# Patient Record
Sex: Female | Born: 1995 | Race: Black or African American | Hispanic: No | Marital: Single | Smoking: Current some day smoker
Health system: Southern US, Community
[De-identification: ages and names within clinical notes are randomized; demographics above are authoritative.]

## PROBLEM LIST (undated history)

## (undated) DIAGNOSIS — F319 Bipolar disorder, unspecified: Secondary | ICD-10-CM

## (undated) HISTORY — PX: NO PAST SURGERIES: SHX2092

---
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
## 2016-12-08 ENCOUNTER — Encounter (HOSPITAL_COMMUNITY): Payer: Self-pay | Admitting: *Deleted

## 2016-12-08 ENCOUNTER — Emergency Department (HOSPITAL_COMMUNITY)
Admission: EM | Admit: 2016-12-08 | Discharge: 2016-12-09 | Disposition: A | Discharge status: Other Acute Inpatient Hospital | Payer: No Typology Code available for payment source | Attending: Emergency Medicine | Admitting: Emergency Medicine

## 2016-12-08 DIAGNOSIS — R45851 Suicidal ideations: Secondary | ICD-10-CM | POA: Diagnosis present

## 2016-12-08 DIAGNOSIS — F1290 Cannabis use, unspecified, uncomplicated: Secondary | ICD-10-CM | POA: Diagnosis not present

## 2016-12-08 DIAGNOSIS — F314 Bipolar disorder, current episode depressed, severe, without psychotic features: Secondary | ICD-10-CM | POA: Diagnosis not present

## 2016-12-08 DIAGNOSIS — Z79899 Other long term (current) drug therapy: Secondary | ICD-10-CM | POA: Diagnosis not present

## 2016-12-08 HISTORY — DX: Bipolar disorder, unspecified: F31.9

## 2016-12-08 LAB — CBC
HCT: 34.3 % — ABNORMAL LOW (ref 36.0–46.0)
Hemoglobin: 11.5 g/dL — ABNORMAL LOW (ref 12.0–15.0)
MCH: 23.4 pg — AB (ref 26.0–34.0)
MCHC: 33.5 g/dL (ref 30.0–36.0)
MCV: 69.9 fL — AB (ref 78.0–100.0)
PLATELETS: 261 10*3/uL (ref 150–400)
RBC: 4.91 MIL/uL (ref 3.87–5.11)
RDW: 14.7 % (ref 11.5–15.5)
WBC: 6.3 10*3/uL (ref 4.0–10.5)

## 2016-12-08 NOTE — ED Notes (Signed)
Bed: WLPT3 Expected date:  Expected time:  Means of arrival:  Comments: 

## 2016-12-08 NOTE — ED Triage Notes (Signed)
"  I have gotten to the point I just really want to take my life".  States SI "on and off for the past 3 weeks" Tonight she reports she was thinking of jumping in front of a car. Hx of Bipolar, not taking medications as prescribed.

## 2016-12-09 ENCOUNTER — Encounter (HOSPITAL_COMMUNITY): Payer: Self-pay

## 2016-12-09 ENCOUNTER — Inpatient Hospital Stay (HOSPITAL_COMMUNITY)
Admission: AD | Admit: 2016-12-09 | Discharge: 2016-12-14 | DRG: 885 | Disposition: A | Discharge status: Home / Self Care | Payer: No Typology Code available for payment source | Source: Intra-hospital | Attending: Psychiatry | Admitting: Psychiatry

## 2016-12-09 DIAGNOSIS — R45851 Suicidal ideations: Secondary | ICD-10-CM | POA: Diagnosis present

## 2016-12-09 DIAGNOSIS — F4322 Adjustment disorder with anxiety: Secondary | ICD-10-CM | POA: Diagnosis present

## 2016-12-09 DIAGNOSIS — Z79899 Other long term (current) drug therapy: Secondary | ICD-10-CM

## 2016-12-09 DIAGNOSIS — F1220 Cannabis dependence, uncomplicated: Secondary | ICD-10-CM | POA: Diagnosis present

## 2016-12-09 DIAGNOSIS — F339 Major depressive disorder, recurrent, unspecified: Secondary | ICD-10-CM | POA: Diagnosis present

## 2016-12-09 DIAGNOSIS — Z915 Personal history of self-harm: Secondary | ICD-10-CM

## 2016-12-09 DIAGNOSIS — F1290 Cannabis use, unspecified, uncomplicated: Secondary | ICD-10-CM

## 2016-12-09 DIAGNOSIS — F314 Bipolar disorder, current episode depressed, severe, without psychotic features: Secondary | ICD-10-CM | POA: Diagnosis present

## 2016-12-09 DIAGNOSIS — Z813 Family history of other psychoactive substance abuse and dependence: Secondary | ICD-10-CM | POA: Diagnosis not present

## 2016-12-09 LAB — COMPREHENSIVE METABOLIC PANEL
ALBUMIN: 4.6 g/dL (ref 3.5–5.0)
ALK PHOS: 64 U/L (ref 38–126)
ALT: 12 U/L — AB (ref 14–54)
AST: 16 U/L (ref 15–41)
Anion gap: 7 (ref 5–15)
BILIRUBIN TOTAL: 0.6 mg/dL (ref 0.3–1.2)
BUN: 13 mg/dL (ref 6–20)
CALCIUM: 9.1 mg/dL (ref 8.9–10.3)
CO2: 24 mmol/L (ref 22–32)
Chloride: 106 mmol/L (ref 101–111)
Creatinine, Ser: 0.65 mg/dL (ref 0.44–1.00)
GFR calc Af Amer: >60 mL/min (ref >60)
GFR calc non Af Amer: >60 mL/min (ref >60)
GLUCOSE: 98 mg/dL (ref 65–99)
Potassium: 3.4 mmol/L — ABNORMAL LOW (ref 3.5–5.1)
Sodium: 137 mmol/L (ref 135–145)
TOTAL PROTEIN: 7.6 g/dL (ref 6.5–8.1)

## 2016-12-09 LAB — RAPID URINE DRUG SCREEN, HOSP PERFORMED
AMPHETAMINES: NOT DETECTED
Barbiturates: NOT DETECTED
Benzodiazepines: NOT DETECTED
Cocaine: NOT DETECTED
OPIATES: NOT DETECTED
TETRAHYDROCANNABINOL: POSITIVE — AB

## 2016-12-09 LAB — ETHANOL: Alcohol, Ethyl (B): <5 mg/dL (ref <5)

## 2016-12-09 LAB — SALICYLATE LEVEL: Salicylate Lvl: <7 mg/dL (ref 2.8–30.0)

## 2016-12-09 LAB — ACETAMINOPHEN LEVEL: Acetaminophen (Tylenol), Serum: <10 ug/mL — ABNORMAL LOW (ref 10–30)

## 2016-12-09 LAB — PREGNANCY, URINE: Preg Test, Ur: NEGATIVE

## 2016-12-09 MED ORDER — ARIPIPRAZOLE 5 MG PO TABS
5.0000 mg | ORAL_TABLET | Freq: Every day | ORAL | Status: DC
  Administered 2016-12-09: 5 mg via ORAL
  Filled 2016-12-09: qty 1

## 2016-12-09 MED ORDER — ENSURE ENLIVE PO LIQD
237.0000 mL | Freq: Two times a day (BID) | ORAL | Status: DC
  Administered 2016-12-10 – 2016-12-14 (×5): 237 mL via ORAL

## 2016-12-09 MED ORDER — ALUM & MAG HYDROXIDE-SIMETH 200-200-20 MG/5ML PO SUSP: 30.0000 mL | ORAL | Status: DC | PRN

## 2016-12-09 MED ORDER — ARIPIPRAZOLE 5 MG PO TABS
5.0000 mg | ORAL_TABLET | Freq: Every day | ORAL | Status: DC
  Administered 2016-12-10: 5 mg via ORAL
  Filled 2016-12-09 (×3): qty 1

## 2016-12-09 MED ORDER — ACETAMINOPHEN 325 MG PO TABS: 650.0000 mg | ORAL_TABLET | ORAL | Status: DC | PRN

## 2016-12-09 MED ORDER — TRAZODONE HCL 50 MG PO TABS
50.0000 mg | ORAL_TABLET | Freq: Every evening | ORAL | Status: DC | PRN
  Administered 2016-12-10 – 2016-12-13 (×4): 50 mg via ORAL
  Filled 2016-12-09 (×3): qty 1

## 2016-12-09 MED ORDER — ACETAMINOPHEN 325 MG PO TABS
650.0000 mg | ORAL_TABLET | ORAL | Status: DC | PRN
Start: 2016-12-09 — End: 2016-12-14
  Administered 2016-12-13: 650 mg via ORAL
  Filled 2016-12-09: qty 2

## 2016-12-09 MED ORDER — IBUPROFEN 200 MG PO TABS: 600.0000 mg | ORAL_TABLET | Freq: Three times a day (TID) | ORAL | Status: DC | PRN

## 2016-12-09 MED ORDER — TRAZODONE HCL 50 MG PO TABS: 50.0000 mg | ORAL_TABLET | Freq: Every evening | ORAL | Status: DC | PRN

## 2016-12-09 MED ORDER — ONDANSETRON HCL 4 MG PO TABS: 4.0000 mg | ORAL_TABLET | Freq: Three times a day (TID) | ORAL | Status: DC | PRN

## 2016-12-09 NOTE — Consult Note (Signed)
[facility] Face-to-Face Psychiatry Consult   Reason for Consult:  Psychiatric evaluation  Referring Physician:  EDP Patient Identification: [name] MRN:  [id number] Principal Diagnosis: Bipolar 1 disorder, depressed, severe ([place]) Diagnosis:   Patient Active Problem List   Diagnosis Date Noted  . Bipolar 1 disorder, depressed, severe ([place]) [F31.4] 12/09/2016    Total Time spent with patient: 30 minutes  Subjective:   [name] is a 21 y.o. female patient who states "I slept outside yesterday."  HPI:  Per behavioral health therapeutic triage assessment, Amanda Gambrellis a 21 y.o.femalebrought in by police with PMHx of bipolar 1 disorder, who presents to the Emergency Department presenting with gradually worsening, constant, suicidal ideations that occurred PTA. Per pt she notes having a job at [employer], which she recently loss. She reports feeling stressed and "being heavy on the weed" for the past three weeks. She states two days ago, pt was asked for rent money by her mother and was kicked out of her home due to non-payment. Per nurse note, pt thought about jumping in front of a car but denies following through with the plan. Pt reports having a h/o of suicidal ideations but denies having a specific plan in place. Per pt, she reports being prescribed medication for her bipolar disorder but states "I don't think my medications really do it so I don't take it". No alleviating factors noted. Pt denies any pain and HI. She also denies using any other illicit drug use. Pt is sleepy during assessment.  She said that Thursday night her mother put her out of the house because she could not pay the rent.  She recently became unemployed.  Patient says that she has had increasing depression.  Patient made statement to a nurse earlier about stepping in front of a car to kill herself.  She now denies wanting to do that.  Patient denies any HI or A/V hallucinations. Patient smokes about a joint  a day of marijuana.  Last night was the last use.  Denies use of ETOH or other drugs. Patient has no outpatient provider.  She has been to [facility] when she was 21 years old.  [facility] Evaluation: Chart and nursing notes reviewed. Face-to-face evaluation completed with Dr [name]. Pt states she has a past psychiatric history of bipolar disorder. She states she moved from [place], [place] 5 months ago. Currently lives with her mother and cousin. States her mother told her "no need to come back home if she couldn't pay a bill." Pt states she recently quit her job. States she slept outside yesterday. States she has been paranoid and "itching and tripping and hearing voices." She reports a past suicide attempt when she was in high school via hanging. Smoke marijuana, 2 blunts/day. Denies ETOH use.   Past Psychiatric History: Bipolar disorder  Risk to Self: Suicidal Ideation: Yes-Currently Present Suicidal Intent: No (When asked about SI now, she says "no.") Is patient at risk for suicide?: Yes Suicidal Plan?: Yes-Currently Present Specify Current Suicidal Plan: Had told a nurse about walking into traffic Access to Means: Yes Specify Access to Suicidal Means: Stepping into traffic What has been your use of drugs/alcohol within the last 12 months?: THC use How many times?: 0 Other Self Harm Risks: None Triggers for Past Attempts: None known Intentional Self Injurious Behavior: None Risk to Others: Homicidal Ideation: No Thoughts of Harm to Others: No Current Homicidal Intent: No Current Homicidal Plan: No Access to Homicidal Means: No Identified Victim: No one History  of harm to others?: No Assessment of Violence: None Noted Violent Behavior Description: None reported Does patient have access to weapons?: No Criminal Charges Pending?: No Does patient have a court date: No Prior Inpatient Therapy: Prior Inpatient Therapy: Yes Prior Therapy Dates: Age 54 Prior Therapy Facilty/Provider(s): [facility] Reason for Treatment: depression Prior Outpatient Therapy: Prior Outpatient Therapy: No Prior Therapy Dates: None currently Prior Therapy Facilty/Provider(s): N/A Reason for Treatment: N/a Does patient have an ACCT team?: No Does patient have Intensive In-House Services?  : No Does patient have [facility] services? : No Does patient have P4CC services?: No  Past Medical History:  Past Medical History:  Diagnosis Date  . Bipolar 1 disorder ([place])    History reviewed. No pertinent surgical history. Family History: No family history on file. Family Psychiatric  History: unknown Social History:  History  Alcohol Use No     History  Drug Use  . Types: Marijuana    Social History   Social History  . Marital status: Single    Spouse name: N/A  . Number of children: N/A  . Years of education: N/A   Social History Main Topics  . Smoking status: Never Smoker  . Smokeless tobacco: Never Used  . Alcohol use No  . Drug use: Yes    Types: Marijuana  . Sexual activity: Not Asked   Other Topics Concern  . None   Social History Narrative  . None   Additional Social History:    Allergies:  No Known Allergies  Labs:  Results for orders placed or performed during the hospital encounter of 12/08/16 (from the past 48 hour(s))  Rapid urine drug screen (hospital performed)     Status: Abnormal   Collection Time: 12/08/16 11:21 PM  Result Value Ref Range   Opiates NONE DETECTED NONE DETECTED   Cocaine NONE DETECTED NONE DETECTED   Benzodiazepines NONE DETECTED NONE DETECTED   Amphetamines NONE DETECTED NONE DETECTED   Tetrahydrocannabinol POSITIVE (A) NONE DETECTED   Barbiturates NONE DETECTED NONE DETECTED    Comment:        DRUG SCREEN FOR MEDICAL PURPOSES ONLY.  IF CONFIRMATION IS NEEDED FOR ANY PURPOSE, NOTIFY LAB WITHIN 5 DAYS.        LOWEST DETECTABLE LIMITS FOR URINE DRUG SCREEN Drug Class       Cutoff (ng/mL) Amphetamine      1000 Barbiturate       200 Benzodiazepine   672 Tricyclics       094 Opiates          300 Cocaine          300 THC              50   Pregnancy, urine     Status: None   Collection Time: 12/08/16 11:21 PM  Result Value Ref Range   Preg Test, Ur NEGATIVE NEGATIVE    Comment:        THE SENSITIVITY OF THIS METHODOLOGY IS >20 mIU/mL.   Comprehensive metabolic panel     Status: Abnormal   Collection Time: 12/08/16 11:33 PM  Result Value Ref Range   Sodium 137 135 - 145 mmol/L   Potassium 3.4 (L) 3.5 - 5.1 mmol/L   Chloride 106 101 - 111 mmol/L   CO2 24 22 - 32 mmol/L   Glucose, Bld 98 65 - 99 mg/dL   BUN 13 6 - 20 mg/dL   Creatinine, Ser 0.65 0.44 - 1.00 mg/dL   Calcium  9.1 8.9 - 10.3 mg/dL   Total Protein 7.6 6.5 - 8.1 g/dL   Albumin 4.6 3.5 - 5.0 g/dL   AST 16 15 - 41 U/L   ALT 12 (L) 14 - 54 U/L   Alkaline Phosphatase 64 38 - 126 U/L   Total Bilirubin 0.6 0.3 - 1.2 mg/dL   GFR calc non Af Amer >60 >60 mL/min   GFR calc Af Amer >60 >60 mL/min    Comment: (NOTE) The eGFR has been calculated using the CKD EPI equation. This calculation has not been validated in all clinical situations. eGFR's persistently <60 mL/min signify possible Chronic Kidney Disease.    Anion gap 7 5 - 15  Ethanol     Status: None   Collection Time: 12/08/16 11:33 PM  Result Value Ref Range   Alcohol, Ethyl (B) <5 <5 mg/dL    Comment:        LOWEST DETECTABLE LIMIT FOR SERUM ALCOHOL IS 5 mg/dL FOR MEDICAL PURPOSES ONLY   Salicylate level     Status: None   Collection Time: 12/08/16 11:33 PM  Result Value Ref Range   Salicylate Lvl <7.7 2.8 - 30.0 mg/dL  Acetaminophen level     Status: Abnormal   Collection Time: 12/08/16 11:33 PM  Result Value Ref Range   Acetaminophen (Tylenol), Serum <10 (L) 10 - 30 ug/mL    Comment:        THERAPEUTIC CONCENTRATIONS VARY SIGNIFICANTLY. A RANGE OF 10-30 ug/mL MAY BE AN EFFECTIVE CONCENTRATION FOR MANY PATIENTS. HOWEVER, SOME ARE BEST TREATED AT CONCENTRATIONS OUTSIDE  THIS RANGE. ACETAMINOPHEN CONCENTRATIONS >150 ug/mL AT 4 HOURS AFTER INGESTION AND >50 ug/mL AT 12 HOURS AFTER INGESTION ARE OFTEN ASSOCIATED WITH TOXIC REACTIONS.   cbc     Status: Abnormal   Collection Time: 12/08/16 11:33 PM  Result Value Ref Range   WBC 6.3 4.0 - 10.5 K/uL   RBC 4.91 3.87 - 5.11 MIL/uL   Hemoglobin 11.5 (L) 12.0 - 15.0 g/dL   HCT 34.3 (L) 36.0 - 46.0 %   MCV 69.9 (L) 78.0 - 100.0 fL   MCH 23.4 (L) 26.0 - 34.0 pg   MCHC 33.5 30.0 - 36.0 g/dL   RDW 14.7 11.5 - 15.5 %   Platelets 261 150 - 400 K/uL    Current Facility-Administered Medications  Medication Dose Route Frequency Provider Last Rate Last Dose  . acetaminophen (TYLENOL) tablet 650 mg  650 mg Oral Q4H PRN Davonna Belling, MD      . alum & mag hydroxide-simeth (MAALOX/MYLANTA) 200-200-20 MG/5ML suspension 30 mL  30 mL Oral PRN Davonna Belling, MD      . ibuprofen (ADVIL,MOTRIN) tablet 600 mg  600 mg Oral Q8H PRN Davonna Belling, MD      . ondansetron [facility]) tablet 4 mg  4 mg Oral Q8H PRN Davonna Belling, MD       Current Outpatient Prescriptions  Medication Sig Dispense Refill  . ibuprofen (ADVIL,MOTRIN) 200 MG tablet Take 800 mg by mouth every 6 (six) hours as needed for moderate pain.      Musculoskeletal: Strength & Muscle Tone: within normal limits Gait & Station: normal Patient leans: N/A  Psychiatric Specialty Exam: Physical Exam  Nursing note and vitals reviewed.   Review of Systems  Psychiatric/Behavioral: Positive for depression, hallucinations and substance abuse.    Blood pressure 96/64, pulse 70, temperature 98.3 F (36.8 C), temperature source Oral, resp. rate 16, height '5\' 8"'  (1.727 m), weight 68.9 kg (152 lb), last  menstrual period 12/06/2016, SpO2 100 %.Body mass index is 23.11 kg/m.  General Appearance: Disheveled  Eye Contact:  Fair  Speech:  Clear and Coherent and Normal Rate  Volume:  Normal  Mood:  Depressed  Affect:  Congruent  Thought Process:  Coherent and  Descriptions of Associations: Intact  Orientation:  Full (Time, Place, and Person)  Thought Content:  Hallucinations: Auditory  Suicidal Thoughts:  No  Homicidal Thoughts:  No  Memory:  Immediate;   Fair Recent;   Fair  Judgement:  Fair  Insight:  Fair  Psychomotor Activity:  Increased  Concentration:  Concentration: Fair and Attention Span: Fair  Recall:  [employer] of Knowledge:  Fair  Language:  Fair  Akathisia:  No  Handed:    AIMS (if indicated):     Assets:  Communication Skills Desire for Improvement Physical Health Resilience  ADL's:  Intact  Cognition:  WNL  Sleep:       Case discussed with Dr [name]; recommendations are: Treatment Plan Summary: Daily contact with patient to assess and evaluate symptoms and progress in treatment and Medication management  Abilify 5 mg daily Trazodone 50 mg QHS   Disposition: Recommend psychiatric Inpatient admission when medically cleared.  Serena Colonel, PMHNP-BC, FNP-BC [facility] 12/09/2016 1:17 PM

## 2016-12-09 NOTE — ED Notes (Signed)
Staff attempted to all report, but not able at this time.

## 2016-12-09 NOTE — ED Notes (Signed)
Pt has been changed and wanded by security.  

## 2016-12-09 NOTE — BH Assessment (Addendum)
Tele Assessment Note   Charnelle Claros is an 21 y.o. female.  -Clinician reviewed note by Dr. Rubin Payor.  Rozlynn Floyd is a 21 y.o. female brought in by police with PMHx of bipolar 1 disorder, who presents to the Emergency Department presenting with gradually worsening, constant, suicidal ideations that occurred PTA. Per pt she notes having a job at [employer], which she recently loss. She reports feeling stressed and "being heavy on the weed" for the past three weeks. She states two days ago, pt was asked for rent money by her mother and was kicked out of her home due to non-payment. Per nurse note, pt thought about jumping in front of a car but denies following through with the plan. Pt reports having a h/o of suicidal ideations but denies having a specific plan in place. Per pt, she reports being prescribed medication for her bipolar disorder but states "I don't think my medications really do it so I don't take it". No alleviating factors noted. Pt denies any pain and HI. She also denies using any other illicit drug use.  Pt is sleepy during assessment.  She said that Thursday night her mother put her out of the house because she could not pay the rent.  She recently became unemployed.  Patient says that she has had increasing depression.    Patient made statement to a nurse earlier about stepping in front of a car to kill herself.  She now denies wanting to do that.  Patient denies any HI or A/V hallucinations.  Patient smokes about a joint a day of marijuana.  Last night was the last use.  Denies use of ETOH or other drugs.  Patient has no outpatient provider.  She has been to [facility] when she was 21 years old.  -Clinician discussed patient care with Nira Conn, FNP who recommends AM psych eval.  Patient would not be able to come to [facility] if she were a candidate until after 04:00.  It is doubtful she will need inpatient care.  Diagnosis: MDD recurrent, severe; Cannabis use d/o  severe  Past Medical History:  Past Medical History:  Diagnosis Date  . Bipolar 1 disorder ([facility])     History reviewed. No pertinent surgical history.  Family History: No family history on file.  Social History:  reports that she has never smoked. She has never used smokeless tobacco. She reports that she uses drugs, including Marijuana. She reports that she does not drink alcohol.  Additional Social History:  Alcohol / Drug Use Pain Medications: None Prescriptions: None Over the Counter: None History of alcohol / drug use?: Yes Substance #1 Name of Substance 1: Marijuana 1 - Age of First Use: 22 years of age 79 - Amount (size/oz): About one joint per night 1 - Frequency: Daily use 1 - Duration: on-going 1 - Last Use / Amount: 04/13  CIWA: CIWA-Ar BP: 99/70 Pulse Rate: 84 COWS:    PATIENT STRENGTHS: (choose at least two) Ability for insight Active sense of humor Average or above average intelligence Capable of independent living Communication skills  Allergies: No Known Allergies  Home Medications:  (Not in a hospital admission)  OB/GYN Status:  Patient's last menstrual period was 12/06/2016.  General Assessment Data Location of Assessment: [facility] TTS Assessment: In system Is this a Tele or Face-to-Face Assessment?: Face-to-Face Is this an Initial Assessment or a Re-assessment for this encounter?: Initial Assessment Marital status: Single Is patient pregnant?: No Pregnancy Status: No Living Arrangements: Parent (Mother  evicted her from the home on Thursday night (04/12)) Can pt return to current living arrangement?: No (Pt behind on rent.) Admission Status: Voluntary Is patient capable of signing voluntary admission?: Yes Referral Source: Self/Family/Friend Insurance type: self pay     Crisis Care Plan Living Arrangements: Parent (Mother evicted her from the home on Thursday night (04/12)) Name of Psychiatrist: None Name of Therapist: None  Education  Status Is patient currently in school?: No Highest grade of school patient has completed: High school graduate  Risk to self with the past 6 months Suicidal Ideation: Yes-Currently Present Has patient been a risk to self within the past 6 months prior to admission? : Yes Suicidal Intent: No (When asked about SI now, she says "no.") Has patient had any suicidal intent within the past 6 months prior to admission? : No Is patient at risk for suicide?: Yes Suicidal Plan?: Yes-Currently Present Has patient had any suicidal plan within the past 6 months prior to admission? : No Specify Current Suicidal Plan: Had told a nurse about walking into traffic Access to Means: Yes Specify Access to Suicidal Means: Stepping into traffic What has been your use of drugs/alcohol within the last 12 months?: THC use Previous Attempts/Gestures: No How many times?: 0 Other Self Harm Risks: None Triggers for Past Attempts: None known Intentional Self Injurious Behavior: None Family Suicide History: No Recent stressful life event(s): Conflict (Comment), Job Loss, Financial Problems (Needing to pay rent to mother; job lost; no job) Persecutory voices/beliefs?: No Depression: Yes Depression Symptoms: Despondent, Guilt, Loss of interest in usual pleasures, Feeling worthless/self pity, Tearfulness Substance abuse history and/or treatment for substance abuse?: Yes Suicide prevention information given to non-admitted patients: Not applicable  Risk to Others within the past 6 months Homicidal Ideation: No Does patient have any lifetime risk of violence toward others beyond the six months prior to admission? : No Thoughts of Harm to Others: No Current Homicidal Intent: No Current Homicidal Plan: No Access to Homicidal Means: No Identified Victim: No one History of harm to others?: No Assessment of Violence: None Noted Violent Behavior Description: None reported Does patient have access to weapons?:  No Criminal Charges Pending?: No Does patient have a court date: No Is patient on probation?: No  Psychosis Hallucinations: None noted Delusions: None noted  Mental Status Report Appearance/Hygiene: Disheveled, In scrubs Eye Contact: Poor Motor Activity: Freedom of movement, Unremarkable Speech: Logical/coherent Level of Consciousness: Drowsy, Sleeping Mood: Depressed, Sad Affect: Sad Anxiety Level: Minimal Thought Processes: Coherent, Relevant Judgement: Impaired Orientation: Person, Place, Time, Situation Obsessive Compulsive Thoughts/Behaviors: None  Cognitive Functioning Concentration: Decreased Memory: Remote Intact, Recent Intact IQ: Average Insight: Poor Impulse Control: Fair Appetite: Fair Weight Loss: 0 Weight Gain: 0 Sleep: Decreased Total Hours of Sleep:  (<6H/D) Vegetative Symptoms: Staying in bed  ADLScreening [facility] Assessment Services) Patient's cognitive ability adequate to safely complete daily activities?: Yes Patient able to express need for assistance with ADLs?: Yes Independently performs ADLs?: Yes (appropriate for developmental age)  Prior Inpatient Therapy Prior Inpatient Therapy: Yes Prior Therapy Dates: Age 79 Prior Therapy Facilty/Provider(s): [facility] Reason for Treatment: depression  Prior Outpatient Therapy Prior Outpatient Therapy: No Prior Therapy Dates: None currently Prior Therapy Facilty/Provider(s): N/A Reason for Treatment: N/a Does patient have an ACCT team?: No Does patient have Intensive In-House Services?  : No Does patient have [facility] services? : No Does patient have P4CC services?: No  ADL Screening (condition at time of admission) Patient's cognitive ability adequate to safely complete  daily activities?: Yes Is the patient deaf or have difficulty hearing?: No Does the patient have difficulty seeing, even when wearing glasses/contacts?: No Does the patient have difficulty concentrating, remembering, or making  decisions?: Yes Patient able to express need for assistance with ADLs?: Yes Does the patient have difficulty dressing or bathing?: No Independently performs ADLs?: Yes (appropriate for developmental age) Does the patient have difficulty walking or climbing stairs?: No Weakness of Legs: None Weakness of Arms/Hands: None       Abuse/Neglect Assessment (Assessment to be complete while patient is alone) Physical Abuse: Denies Verbal Abuse: Denies Sexual Abuse: Denies Exploitation of patient/patient's resources: Denies Self-Neglect: Denies     Merchant navy officer (For Healthcare) Does Patient Have a Medical Advance Directive?: No Would patient like information on creating a medical advance directive?: No - Patient declined    Additional Information 1:1 In Past 12 Months?: No CIRT Risk: No Elopement Risk: No Does patient have medical clearance?: Yes     Disposition:  Disposition Initial Assessment Completed for this Encounter: Yes Disposition of Patient: Other dispositions Other disposition(s): Other (Comment) (Pt to be reviewed with FNP)  Beatriz Stallion Ray 12/09/2016 3:23 AM

## 2016-12-09 NOTE — ED Notes (Signed)
SBAR Report received from previous nurse. Pt received calm and visible on unit. Pt denies current SI/ HI, A/V H, depression, anxiety, or pain at this time, and appears otherwise stable and free of distress. Pt does endorse increasing depression over past several weeks.  Pt reminded of camera surveillance, q 15 min rounds, and rules of the milieu. Will continue to assess.

## 2016-12-09 NOTE — Progress Notes (Signed)
Patient did not attend wrap up group. 

## 2016-12-09 NOTE — ED Notes (Signed)
Pt has been appropriate on the unit today, she has been in the bed most of the day. Pt reported that she was not having any thoughts of hurting herself or others. Pt reported that she was not hearing any voices, but was paranoid in thinking that someone was watching her. Pt reported that she did not know what her plan was, because she did not have anywhere to go. Pt reported that going back home with her mother is not a option. No other issues or concerns noted.

## 2016-12-09 NOTE — ED Provider Notes (Signed)
[facility]-EMERGENCY DEPT Provider Note   CSN: [id number] Arrival date & time: 12/08/16  2309    By signing my name below, I, Valentino Saxon, attest that this documentation has been prepared under the direction and in the presence of Benjiman Core, MD. Electronically Signed: Valentino Saxon, ED Scribe. 12/09/16. 1:06 AM.  History   Chief Complaint Chief Complaint  Patient presents with  . Suicidal   The history is provided by the patient. No language interpreter was used.   HPI Comments: [name] is a 21 y.o. female brought in by police with PMHx of bipolar 1 disorder, who presents to the Emergency Department presenting with gradually worsening, constant, suicidal ideations that occurred PTA. Per pt she notes having a job at [employer], which she recently loss. She reports feeling stressed and "being heavy on the weed" for the past three weeks. She states two days ago, pt was asked for rent money by her mother and was kicked out of her home due to non-payment. Per nurse note, pt thought about jumping in front of a car but denies following through with the plan. Pt reports having a h/o of suicidal ideations but denies having a specific plan in place. Per pt, she reports being prescribed medication for her bipolar disorder but states "I don't think my medications really do it so I don't take it". No alleviating factors noted. Pt denies any pain and HI. She also denies using any other illicit drug use.   Past Medical History:  Diagnosis Date  . Bipolar 1 disorder (HCC)     There are no active problems to display for this patient.   History reviewed. No pertinent surgical history.  OB History    No data available       Home Medications    Prior to Admission medications   Medication Sig Start Date End Date Taking? Authorizing Provider  ibuprofen (ADVIL,MOTRIN) 200 MG tablet Take 800 mg by mouth every 6 (six) hours as needed for moderate pain.   Yes Historical Provider, MD      Family History No family history on file.  Social History Social History  Substance Use Topics  . Smoking status: Never Smoker  . Smokeless tobacco: Never Used  . Alcohol use No     Allergies   Patient has no known allergies.   Review of Systems Review of Systems  Psychiatric/Behavioral: Positive for suicidal ideas.       -homicidal ideations  All other systems reviewed and are negative.    Physical Exam Updated Vital Signs BP 96/64 (BP Location: Right Arm)   Pulse 70   Temp 98.3 F (36.8 C) (Oral)   Resp 16   Ht  (1.727 m)   Wt 152 lb (68.9 kg)   LMP 12/06/2016   SpO2 100%   BMI 23.11 kg/m   Physical Exam  Constitutional: She appears well-developed and well-nourished.  HENT:  Head: Normocephalic and atraumatic.  Not responding to internal stimuli.   Eyes: Conjunctivae are normal. Right eye exhibits no discharge. Left eye exhibits no discharge.  Pulmonary/Chest: Effort normal. No respiratory distress.  Neurological: She is alert. Coordination normal.  Skin: Skin is warm and dry. No rash noted. She is not diaphoretic. No erythema.  Psychiatric: She has a normal mood and affect.  Nursing note and vitals reviewed.    ED Treatments / Results   DIAGNOSTIC STUDIES: Oxygen Saturation is 99% on RA, normal by my interpretation.    COORDINATION OF CARE: 12:08 AM  Discussed treatment plan with pt at bedside which includes labs and pain medication and pt agreed to plan.   Labs (all labs ordered are listed, but only abnormal results are displayed) Labs Reviewed  COMPREHENSIVE METABOLIC PANEL - Abnormal; Notable for the following:       Result Value   Potassium 3.4 (*)    ALT 12 (*)    All other components within normal limits  ACETAMINOPHEN LEVEL - Abnormal; Notable for the following:    Acetaminophen (Tylenol), Serum <10 (*)    All other components within normal limits  CBC - Abnormal; Notable for the following:    Hemoglobin 11.5 (*)    HCT  34.3 (*)    MCV 69.9 (*)    MCH 23.4 (*)    All other components within normal limits  RAPID URINE DRUG SCREEN, HOSP PERFORMED - Abnormal; Notable for the following:    Tetrahydrocannabinol POSITIVE (*)    All other components within normal limits  ETHANOL  SALICYLATE LEVEL  PREGNANCY, URINE    EKG  EKG Interpretation None       Radiology No results found.  Procedures Procedures (including critical care time)  Medications Ordered in ED Medications  ibuprofen (ADVIL,MOTRIN) tablet 600 mg (not administered)  acetaminophen (TYLENOL) tablet 650 mg (not administered)  ondansetron (ZOFRAN) tablet 4 mg (not administered)  alum & mag hydroxide-simeth (MAALOX/MYLANTA) 200-200-20 MG/5ML suspension 30 mL (not administered)     Initial Impression / Assessment and Plan / ED Course  I have reviewed the triage vital signs and the nursing notes.  Pertinent labs & imaging results that were available during my care of the patient were reviewed by me and considered in my medical decision making (see chart for details).     Patient with suicidal thoughts and depression. Wants to take her life. Somewhat due to being homeless 2. Appears medically cleared. Has a plan of jumping in front of a car. Has not been taking medicines. To be seen by TTS. Medically cleared at this time.  Final Clinical Impressions(s) / ED Diagnoses   Final diagnoses:  Suicidal ideation    New Prescriptions New Prescriptions   No medications on file   I personally performed the services described in this documentation, which was scribed in my presence. The recorded information has been reviewed and is accurate.       Benjiman Core, MD 12/09/16 [phone]

## 2016-12-09 NOTE — Clinical Social Work Note (Signed)
    [facility] has a bed for patient today room 303 - 1 and they are ready for him but transport ([facility]) is running behind.  Elray Buba, LCSW [facility] Clinical Social Worker - Weekend Coverage

## 2016-12-09 NOTE — ED Notes (Signed)
Pt is being transferred to [facility], report was called to [employer]. [facility] called.
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
## 2016-12-10 DIAGNOSIS — F314 Bipolar disorder, current episode depressed, severe, without psychotic features: Principal | ICD-10-CM

## 2016-12-10 DIAGNOSIS — F1290 Cannabis use, unspecified, uncomplicated: Secondary | ICD-10-CM

## 2016-12-10 MED ORDER — ARIPIPRAZOLE 10 MG PO TABS
10.0000 mg | ORAL_TABLET | Freq: Every day | ORAL | Status: DC
  Administered 2016-12-11 – 2016-12-14 (×4): 10 mg via ORAL
  Filled 2016-12-10 (×7): qty 1

## 2016-12-10 NOTE — Progress Notes (Signed)
D: Pt was flat and withdrawn to room-Pt remained in bed resting with eyes closed. Pt endorsed moderate depression and anxiety; states, "I have a lot in my mind and I don't know where to start; I also need some sleep." Pt does not look to be in any distress.  A: Medications offered as prescribed. Support, encouragement, and safe environment provided. 15-minute safety checks continue. R: Pt was med compliant. Pt did not attend wrap-up group. Safety checks continue.

## 2016-12-10 NOTE — Progress Notes (Signed)
Adult Psychoeducational Group Note  Date:  12/10/2016 Time:  9:25 PM  Group Topic/Focus:  Wrap-Up Group:   The focus of this group is to help patients review their daily goal of treatment and discuss progress on daily workbooks.  Participation Level:  Active  Participation Quality:  Appropriate  Affect:  Appropriate  Cognitive:  Alert  Insight: Appropriate  Engagement in Group:  Engaged  Modes of Intervention:  Discussion  Additional Comments:  Patient stated, "My day started off drowsy. I couldn't stay up". Patient's goal for today was to be truthful to self.  [name] 12/10/2016, 9:25 PM

## 2016-12-10 NOTE — Progress Notes (Signed)
D.  Pt pleasant on approach, denies complaints at this time.  Pt was positive for evening group, interacting appropriately with peers on the unit.  Pt denies SI/HI/hallucinations at this time.  A.  Support and encouragement offered, medications given as ordered  R.  Pt remains safe on the unit, will continue to monitor.
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
## 2016-12-10 NOTE — BHH Suicide Risk Assessment (Signed)
[facility] Admission Suicide Risk Assessment   Nursing information obtained from:    Demographic factors:    Current Mental Status:    Loss Factors:    Historical Factors:    Risk Reduction Factors:     Total Time spent with patient: 30 minutes Principal Problem: <principal problem not specified> Diagnosis:   Patient Active Problem List   Diagnosis Date Noted  . Bipolar 1 disorder, depressed, severe (HCC) [F31.4] 12/09/2016   Subjective Data:  21 yo AAF lives with her mom but was recently kicked out. Brought to the ER by the police. Reports worsening thoughts of suicide. Has been there for the past three weeks. She lost her job about the same time. Her mom kicked her out two days ago as she could no longer afford rent. Patient reports thoughts of jumping in front of a car. UDS was positive.  At interview, patient reports that she quit her job after being there for a month. She felt she was being overworked. Says her mom was upset with her impulsive decision. Says she kicked her out of the house. Patient states that she slept at the gymn the first night. Says she tried to plead with her mom to let her back in. Says her mom would not answer her calls. Patient says she felt desperate and was having thoughts that life was not worth living if she continued that way. Says she called the police herself.  Patient reports easy irritability and impulsivity. She reports heavy use of THC. Says her girlfriend just enjoys them chilling with THC. Says it takes her away from reality but she gets anxious once reality sets in again. Missing out on her responsibilities and the consequences as main factor that drives her anxiety. Patient states that otherwise, she is pleasant. She reports difficulty getting into sleep. She good energy levels despite limited sleep. No hallucination in any modality. No persecution or paranoia. No grandiose delusion. No thoughts of harming anyone. No other substance use other than THC.   Currently she is not having suicidal thoughts. She hopes to use this opportunity to get her life back on track. She was started on Aripiprazole. She is tolerating it well and plans to stay on it.   Continued Clinical Symptoms:  Alcohol Use Disorder Identification Test Final Score (AUDIT): 0 The "Alcohol Use Disorders Identification Test", Guidelines for Use in Primary Care, Second Edition.  World Science writer [facility]). Score between 0-7:  no or low risk or alcohol related problems. Score between 8-15:  moderate risk of alcohol related problems. Score between 16-19:  high risk of alcohol related problems. Score 20 or above:  warrants further diagnostic evaluation for alcohol dependence and treatment.   CLINICAL FACTORS:  Substance use Bipolar disorder   Musculoskeletal: Strength & Muscle Tone: within normal limits Gait & Station: normal Patient leans: N/A  Psychiatric Specialty Exam: Physical Exam As in H&P  ROS  Blood pressure 106/71, pulse 82, temperature 98.2 F (36.8 C), temperature source Oral, resp. rate 18, height  (1.727 m), weight 60.3 kg (133 lb), last menstrual period 12/06/2016, SpO2 100 %.Body mass index is 20.22 kg/m.  General Appearance: As in H&P  Eye Contact:  As in H&P  Speech:  Pressured  Volume:  Normal  Mood:  As in H&P  Affect:  As in H&P  Thought Process:  As in H&P  Orientation:  As in H&P  Thought Content:  As in H&P  Suicidal Thoughts:  As in H&P  Homicidal  Thoughts:  As in H&P  Memory:  As in H&P  Judgement:  As in H&P  Insight:  As in H&P  Psychomotor Activity:  As in H&P  Concentration:  As in H&P  Recall:  As in H&P  Fund of Knowledge:  As in H&P  Language:  As in H&P  Akathisia:  No   Handed:    AIMS (if indicated):     Assets:  As in H&P  ADL's:  As in H&P  Cognition:  As in H&P  Sleep:  Number of Hours: 6.5      COGNITIVE FEATURES THAT CONTRIBUTE TO RISK:  None    SUICIDE RISK:   Mild:  Suicidal ideation of  limited frequency, intensity, duration, and specificity.  There are no identifiable plans, no associated intent, mild dysphoria and related symptoms, good self-control (both objective and subjective assessment), few other risk factors, and identifiable protective factors, including available and accessible social support.  PLAN OF CARE: As in H&P  I certify that inpatient services furnished can reasonably be expected to improve the patient's condition.   Georgiann Cocker, MD 12/10/2016, 12:42 PM

## 2016-12-10 NOTE — B[facility] Group Notes (Signed)
BHH Group Notes:  (Clinical Social Work)  12/10/2016  10:00AM-11:00AM  Summary of Progress/Problems:  The main focus of today's process group was to listen to a variety of genres of music and to identify that different types of music provoke different responses.  The patient then was able to identify personally what was soothing for them, as well as energizing, as well as how patient can personally use this knowledge in sleep habits, with depression, and with other symptoms.  The patient expressed at the beginning of group the overall feeling of "okay" and participated fully in group, not only listening to music and singing, dancing, but also talking about the emotions evoked with each song.  Type of Therapy:  Music Therapy   Participation Level:  Active  Participation Quality:  Attentive and Sharing  Affect:  Euphoric  Cognitive:  Oriented  Insight:  Engaged  Engagement in Therapy:  Engaged  Modes of Intervention:   Activity, Exploration  Ambrose Mantle, LCSW 12/10/2016

## 2016-12-10 NOTE — Tx Team (Signed)
Initial Treatment Plan 12/10/2016 12:00 AM Tereka Tortorella AVW:[id number]    PATIENT STRESSORS: Financial difficulties Marital or family conflict Occupational concerns Substance abuse Traumatic event   PATIENT STRENGTHS: Capable of independent living Manufacturing systems engineer Physical Health   PATIENT IDENTIFIED PROBLEMS: "Positive coping skills for my anger"  "I want this bad feelings to stop"  Depression  Anxiety  Risk for suicide  Substance abuse           DISCHARGE CRITERIA:  Ability to meet basic life and health needs Adequate post-discharge living arrangements Verbal commitment to aftercare and medication compliance Withdrawal symptoms are absent or subacute and managed without 24-hour nursing intervention  PRELIMINARY DISCHARGE PLAN: Outpatient therapy Placement in alternative living arrangements  PATIENT/FAMILY INVOLVEMENT: This treatment plan has been presented to and reviewed with the patient, Kynslee Baham, and/or family member.  The patient and family have been given the opportunity to ask questions and make suggestions.  Eveline Keto, RN 12/10/2016, 12:00 AM

## 2016-12-10 NOTE — B[facility] Counselor (Signed)
Adult Comprehensive Assessment  Patient ID: [name], female   DOB: 1996-02-14, 21 y.o.   MRN: [id number]  Information Source: Information source: Patient  Current Stressors:  Educational / Learning stressors: Denies stressors Employment / Job issues: Keeping a job is difficult, is given too many hours at once, needs a break over the weekend. Family Relationships: #1 stressor is family - lives with mother, older cousin, and little cousin.  Cannot get along with older cousin or mother, who complain about everything she does, makes her want to smoke more. Financial / Lack of resources (include bankruptcy): Needs to work on keeping money. Housing / Lack of housing: Needs to have her own place to live.  Living with mother and cousin is stressful.  Mother kicked her out on Thursday, and she slept in the gym. Physical health (include injuries & life threatening diseases): Denies stressors Social relationships: Dating a girl who "got me into weed, and now all I want to do is hang.  We argue when I don't have weed." Substance abuse: Denies stressors Bereavement / Loss: Denies stressors  Living/Environment/Situation:  Living Arrangements: Other (Comment) (Gym) Living conditions (as described by patient or guardian): Kicked out by mother 3 days ago.  Sneaks into gym and sleeps on the floor. How long has patient lived in current situation?: 3 days. What is atmosphere in current home: Temporary  Family History:  Marital status: Long term relationship Long term relationship, how long?: 1 year What types of issues is patient dealing with in the relationship?: Communication Are you sexually active?: Yes What is your sexual orientation?: Lesbian Does patient have children?: No  Childhood History:  By whom was/is the patient raised?: Mother, Malen Gauze parents Additional childhood history information: No contact with dad growing up.  In and out of 4 foster care homes and 2 group homes from age  [age]yo. Description of patient's relationship with caregiver when they were a child: Amazing relationship with mother as a child, but taken away and placed in foster care from age [age]yo.  Then mother spoiled her when she came back. Patient's description of current relationship with people who raised him/her: Does not like mother now. How were you disciplined when you got in trouble as a child/adolescent?: Whoopings, take phone away. Does patient have siblings?: Yes Number of Siblings: 2 Description of patient's current relationship with siblings: younger twin brother and sister, both 97yo - has no contact with them.  Used to have visitation, but no longer.  Has not seen them in 3 years. Did patient suffer any verbal/emotional/physical/sexual abuse as a child?: No Did patient suffer from severe childhood neglect?: No Has patient ever been sexually abused/assaulted/raped as an adolescent or adult?: No Was the patient ever a victim of a crime or a disaster?: No Witnessed domestic violence?: No Has patient been effected by domestic violence as an adult?: No  Education:  Highest grade of school patient has completed: High school graduate Currently a student?: No Learning disability?: Yes What learning problems does patient have?: ADHD  Employment/Work Situation:   Employment situation: Unemployed What is the longest time patient has a held a job?: 3 years Where was the patient employed at that time?: [employer] Has patient ever been in the [employer]?: No Are There Guns or Other Weapons in Your Home?: No  Financial Resources:   Financial resources: No income, Medicaid Does patient have a Lawyer or guardian?: No  Alcohol/Substance Abuse:   What has been your use of drugs/alcohol within the last  12 months?: Marijuana daily Alcohol/Substance Abuse Treatment Hx: Denies past history Has alcohol/substance abuse ever caused legal problems?: No  Social Support System:   Academic librarian Support System: Fair Development worker, community Support System: Mother, girlfriend, best friend Type of faith/religion: None How does patient's faith help to cope with current illness?: N/A  Leisure/Recreation:   Leisure and Hobbies: Basketball, drawing  Strengths/Needs:   What things does the patient do well?: Basketball, drawing, selfies "I just love looking at myself." In what areas does patient struggle / problems for patient: Employment, money, decision-making, housing  Discharge Plan:   Does patient have access to transportation?: No Plan for no access to transportation at discharge: To be explored with social worker Will patient be returning to same living situation after discharge?: No Plan for living situation after discharge: Is not sure where she will go, does not want to go back to sleeping on the floor of the gym.  Will try to find someone to stay with.  States she has only been here in [place] for about 5 months, [place] Currently receiving community mental health services: No If no, would patient like referral for services when discharged?: Yes (What county?) (States she needs a Paramedic.  [place], [place]) Does patient have financial barriers related to discharge medications?: No (States she has Medicaid and can get the co-pay)  Summary/Recommendations:   Summary and Recommendations (to be completed by the evaluator): Patient is a 21yo female admitted with suicidal ideation with a plan to jump in front of a car and a past attempt by hanging. She reports paranoia, "itching and tripping and hearing voices." Primary stressors include moving from [place] 5 months ago, recent job loss, relationship issues with mother/cousin and with girlfriend, kicked out of mother's home due to lack of rent payment, and heavy marijuana use (acknowledges 2 blunts daily).  Patient will benefit from crisis stabilization, medication evaluation, group therapy and  psychoeducation, in addition to case management for discharge planning. At discharge it is recommended that Patient adhere to the established discharge plan and continue in treatment.  Lynnell Chad. 12/10/2016

## 2016-12-11 ENCOUNTER — Encounter (HOSPITAL_COMMUNITY): Payer: Self-pay | Admitting: Psychiatry

## 2016-12-11 DIAGNOSIS — Z79899 Other long term (current) drug therapy: Secondary | ICD-10-CM

## 2016-12-11 DIAGNOSIS — Z813 Family history of other psychoactive substance abuse and dependence: Secondary | ICD-10-CM

## 2016-12-11 DIAGNOSIS — F1220 Cannabis dependence, uncomplicated: Secondary | ICD-10-CM

## 2016-12-11 MED ORDER — OLANZAPINE 5 MG PO TABS: 5.0000 mg | ORAL_TABLET | Freq: Three times a day (TID) | ORAL | Status: DC | PRN

## 2016-12-11 MED ORDER — LAMOTRIGINE 25 MG PO TABS
25.0000 mg | ORAL_TABLET | Freq: Every day | ORAL | Status: DC
  Administered 2016-12-11 – 2016-12-14 (×4): 25 mg via ORAL
  Filled 2016-12-11 (×7): qty 1

## 2016-12-11 MED ORDER — OLANZAPINE 10 MG IM SOLR: 5.0000 mg | Freq: Three times a day (TID) | INTRAMUSCULAR | Status: DC | PRN

## 2016-12-11 NOTE — [facility] Group Notes (Signed)
B[facility] LCSW Group Therapy  12/11/2016 1:15pm  Type of Therapy:  Group Therapy vercoming Obstacles  Participation Level:  Active  Participation Quality:  Appropriate   Affect:  Appropriate  Cognitive:  Appropriate and Oriented  Insight:  Developing/Improving and Improving  Engagement in Therapy:  Improving  Modes of Intervention:  Discussion, Exploration, Problem-solving and Support  Description of Group:   In this group patients will be encouraged to explore what they see as obstacles to their own wellness and recovery. They will be guided to discuss their thoughts, feelings, and behaviors related to these obstacles. The group will process together ways to cope with barriers, with attention given to specific choices patients can make. Each patient will be challenged to identify changes they are motivated to make in order to overcome their obstacles. This group will be process-oriented, with patients participating in exploration of their own experiences as well as giving and receiving support and challenge from other group members.  Summary of Patient Progress: Pt identified housing and family conflict as primary obstacles that she is facing. She was able to describe being able to persevere through a difficult situation during adolescence which proved to herself that she can achieve things that are difficult.    Therapeutic Modalities:   Cognitive Behavioral Therapy Solution Focused Therapy Motivational Interviewing Relapse Prevention Therapy   Vernie Shanks, LCSW 12/11/2016 3:28 PM

## 2016-12-11 NOTE — Progress Notes (Signed)
Nutrition Brief Note  Patient identified on the Malnutrition Screening Tool (MST) Report  Wt Readings from Last 15 Encounters:  12/09/16 133 lb (60.3 kg) (59 %, Z= 0.22)*  12/08/16 152 lb (68.9 kg) (82 %, Z= 0.90)*   * Growth percentiles are based on CDC 2-20 Years data.   *Assume there is an error in the weight records.  Body mass index is 20.22 kg/m. Patient meets criteria for normal based on current BMI.   Labs and medications reviewed.   No nutrition interventions warranted at this time. If nutrition issues arise, please consult RD.   Tilda Franco, MS, RD, LDN Pager: [phone] After Hours Pager: [phone]

## 2016-12-11 NOTE — Tx Team (Signed)
Interdisciplinary Treatment and Diagnostic Plan Update  12/11/2016 Time of Session: 9:30am [name] MRN: [id number]  Principal Diagnosis: Bipolar 1 disorder, depressed, severe (HCC)  Secondary Diagnoses: Principal Problem:   Bipolar 1 disorder, depressed, severe (HCC) Active Problems:   Cannabis use disorder, moderate, dependence (HCC)   Current Medications:  Current Facility-Administered Medications  Medication Dose Route Frequency Provider Last Rate Last Dose  . acetaminophen (TYLENOL) tablet 650 mg  650 mg Oral Q4H PRN Kristeen Mans, NP      . alum & mag hydroxide-simeth (MAALOX/MYLANTA) 200-200-20 MG/5ML suspension 30 mL  30 mL Oral PRN Kristeen Mans, NP      . ARIPiprazole (ABILIFY) tablet 10 mg  10 mg Oral Daily Georgiann Cocker, MD   10 mg at 12/11/16 0816  . feeding supplement (ENSURE ENLIVE) (ENSURE ENLIVE) liquid 237 mL  237 mL Oral BID BM Rockey Situ Cobos, MD   237 mL at 12/11/16 1423  . lamoTRIgine (LAMICTAL) tablet 25 mg  25 mg Oral Daily Jomarie Longs, MD   25 mg at 12/11/16 1423  . OLANZapine (ZYPREXA) tablet 5 mg  5 mg Oral TID PRN Jomarie Longs, MD       Or  . OLANZapine (ZYPREXA) injection 5 mg  5 mg Intramuscular TID PRN Jomarie Longs, MD      . traZODone (DESYREL) tablet 50 mg  50 mg Oral QHS PRN Kristeen Mans, NP   50 mg at 12/10/16 2154    PTA Medications: Prescriptions Prior to Admission  Medication Sig Dispense Refill Last Dose  . ibuprofen (ADVIL,MOTRIN) 200 MG tablet Take 800 mg by mouth every 6 (six) hours as needed for moderate pain.   unknown    Treatment Modalities: Medication Management, Group therapy, Case management,  1 to 1 session with clinician, Psychoeducation, Recreational therapy.  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 Health  Physician Treatment Plan for  Primary Diagnosis: Bipolar 1 disorder, depressed, severe (HCC) Long Term Goal(s): Improvement in symptoms so as ready for discharge  Short Term Goals: Ability to identify changes in lifestyle to reduce recurrence of condition will improve Ability to verbalize feelings will improve Ability to disclose and discuss suicidal ideas Ability to demonstrate self-control will improve Ability to identify and develop effective coping behaviors will improve Ability to maintain clinical measurements within normal limits will improve Compliance with prescribed medications will improve Ability to identify triggers associated with substance abuse/mental health issues will improve Ability to identify changes in lifestyle to reduce recurrence of condition will improve Ability to verbalize feelings will improve Ability to disclose and discuss suicidal ideas Ability to demonstrate self-control will improve Ability to identify and develop effective coping behaviors will improve Ability to maintain clinical measurements within normal limits will improve Compliance with prescribed medications will improve Ability to identify triggers associated with substance abuse/mental health issues will improve  Medication Management: Evaluate patient's response, side effects, and tolerance of medication regimen.  Therapeutic Interventions: 1 to 1 sessions, Unit Group sessions and Medication administration.  Evaluation of Outcomes: Progressing  Physician Treatment Plan for Secondary Diagnosis: Principal Problem:   Bipolar 1 disorder, depressed, severe (HCC) Active Problems:   Cannabis use disorder, moderate, dependence (HCC)   Long Term Goal(s): Improvement in symptoms so as ready for discharge  Short Term Goals: Ability to identify changes in lifestyle to reduce recurrence of condition will improve Ability to verbalize feelings  will improve Ability to disclose and discuss suicidal ideas Ability to demonstrate  self-control will improve Ability to identify and develop effective coping behaviors will improve Ability to maintain clinical measurements within normal limits will improve Compliance with prescribed medications will improve Ability to identify triggers associated with substance abuse/mental health issues will improve Ability to identify changes in lifestyle to reduce recurrence of condition will improve Ability to verbalize feelings will improve Ability to disclose and discuss suicidal ideas Ability to demonstrate self-control will improve Ability to identify and develop effective coping behaviors will improve Ability to maintain clinical measurements within normal limits will improve Compliance with prescribed medications will improve Ability to identify triggers associated with substance abuse/mental health issues will improve  Medication Management: Evaluate patient's response, side effects, and tolerance of medication regimen.  Therapeutic Interventions: 1 to 1 sessions, Unit Group sessions and Medication administration.  Evaluation of Outcomes: Progressing   RN Treatment Plan for Primary Diagnosis: Bipolar 1 disorder, depressed, severe (HCC) Long Term Goal(s): Knowledge of disease and therapeutic regimen to maintain health will improve  Short Term Goals: Ability to verbalize feelings will improve, Ability to disclose and discuss suicidal ideas and Ability to identify and develop effective coping behaviors will improve  Medication Management: RN will administer medications as ordered by provider, will assess and evaluate patient's response and provide education to patient for prescribed medication. RN will report any adverse and/or side effects to prescribing provider.  Therapeutic Interventions: 1 on 1 counseling sessions, Psychoeducation, Medication administration, Evaluate responses to treatment, Monitor vital signs and CBGs as ordered, Perform/monitor CIWA, COWS, AIMS and Fall  Risk screenings as ordered, Perform wound care treatments as ordered.  Evaluation of Outcomes: Progressing   LCSW Treatment Plan for Primary Diagnosis: Bipolar 1 disorder, depressed, severe (HCC) Long Term Goal(s): Safe transition to appropriate next level of care at discharge, Engage patient in therapeutic group addressing interpersonal concerns.  Short Term Goals: Engage patient in aftercare planning with referrals and resources and Increase skills for wellness and recovery  Therapeutic Interventions: Assess for all discharge needs, 1 to 1 time with Social worker, Explore available resources and support systems, Assess for adequacy in community support network, Educate family and significant other(s) on suicide prevention, Complete Psychosocial Assessment, Interpersonal group therapy.  Evaluation of Outcomes: Progressing   Progress in Treatment: Attending groups: Yes  Participating in groups: Yes  Taking medication as prescribed: Yes, MD continues to assess for medication changes as needed Toleration medication: Yes, no side effects reported at this time Family/Significant other contact made: Yes with mother Patient understands diagnosis: Continuing to assess Discussing patient identified problems/goals with staff: Yes Medical problems stabilized or resolved: Yes Denies suicidal/homicidal ideation: Yes Issues/concerns per patient self-inventory: None Other: N/A  New problem(s) identified: None identified at this time.   New Short Term/Long Term Goal(s): None identified at this time.   Discharge Plan or Barriers: CSW will assess for appropriate discharge plan and relevant barriers.   Reason for Continuation of Hospitalization: Anxiety Depression Medication stabilization  Estimated Length of Stay: 2-4 days  Attendees: Patient: 12/11/2016  3:25 PM  Physician: Dr. Elna Breslow 12/11/2016  3:25 PM  Nursing: Olivia Mackie, RN 12/11/2016  3:25 PM  RN Care Manager: Onnie Boer, RN  12/11/2016  3:25 PM  Social Worker: Richelle Ito, LCSW 12/11/2016  3:25 PM  Recreational Therapist:  12/11/2016  3:25 PM  Other:  12/11/2016  3:25 PM  Other:  12/11/2016  3:25 PM  Other: 12/11/2016  3:25 PM  Scribe for Treatment Team: Verdene Lennert, LCSW 12/11/2016 3:25 PM

## 2016-12-11 NOTE — Progress Notes (Addendum)
D:Pt A & O X3. Visible in dayroom interacting with peers, playing cards. Pt denies SI, HI, AVH and pain when assessed. Eye contact is fair, pt presents anxious but pleasant on approach with pressured speech; animated at intervals during interactions. Rates her depression, hopelessness and anxiety all 0/10 on self inventory sheet. Pt reports she's eating and sleeping good. States her energy is normal with good concentration level. Pt's goal for today is "keeping my mind right". "It's been an emotional day, I called my mom and I cursed her on the phone because she lied to the doctor; I will call her tomorrow and apologize". A: Medications administered as prescribed, effects monitored. Encouraged pt to voice concerns, comply with treatment regimen including groups. Support and availability provided to pt throughout this shift. EKG done as ordered. Q 15 minutes checks remains effective without outburst or self harm gestures to note thus far.  R: Pt took her medications without issues. Denies adverse drug reactions when assessed. Attended unit groups as scheduled and was engaged in discussions. Off unit for activities and meals with peers; returned without issues. Tolerated all PO intake well. POC  continues for safety and mood stability.

## 2016-12-11 NOTE — Progress Notes (Signed)
Recreation Therapy Notes  Date: 12/11/16 Time: 1000 Location: [address] Dayroom  Group Topic: Coping Skills  Goal Area(s) Addresses:  Patient will be able to identify positive coping skills. Patient will be able to identify the benefits of coping skills. Patient will be able to identify benefits of using of coping skills post d/c.  Behavioral Response: Engaged  Intervention: Wellsite geologist, magazines, glue sticks, Holiday representative paper  Activity: Counsellor.  Patients were to identify coping skills that could be used for diversions, socially, cognitively, tension releasers and physically.  Patients were to cut out pictures from magazines to represent their coping skills for each category.  Education: Pharmacologist, Building control surveyor.   Education Outcome: Acknowledges understanding/In group clarification offered/Needs additional education.   Clinical Observations/Feedback: Pt was social with peers.  Pt was engaged and worked well on her coping skills until group was ended.   Caroll Rancher, LRT/CTRS         Caroll Rancher A 12/11/2016 12:33 PM

## 2016-12-11 NOTE — Progress Notes (Signed)
Adult Psychoeducational Group Note  Date:  12/11/2016 Time:  8:49 PM  Group Topic/Focus:  Wrap-Up Group:   The focus of this group is to help patients review their daily goal of treatment and discuss progress on daily workbooks.  Participation Level:  Active  Participation Quality:  Appropriate  Affect:  Appropriate  Cognitive:  Appropriate  Insight: Appropriate  Engagement in Group:  Engaged  Modes of Intervention:  Discussion  Additional Comments: The patient expressed that she rates today a 7.The patient also said that she rates today a 7.  Octavio Manns 12/11/2016, 8:49 PM

## 2016-12-11 NOTE — Progress Notes (Signed)
[facility] MD Progress Note  12/11/2016 1:37 PM [name]  MRN:  [id number] Subjective: Patient states " I have ups and downs , I slept better last night."  Objective:Patient seen and chart reviewed.Discussed patient with treatment team.   Pt today seen as irritable , pressured , but goal directed. Pt upset with her family, her mom. Pt reports continued sadness and anxiety about her situation. Pt reports smoking cannabis 2 to 5 joints per day since the past atleast a year. With patient's consent contacted mother - Ms. Inetta Fermo - who reported pt as argumentative and disrespectful, lied to her about her job , could not pay bills and hence was kicked out. Mother states she lives with someone else and the place is not hers , hence she could not let her stay there anymore. Mother believes patient is dealing with a lot of conflicts , she was being followed by [employer] in the past , from 48 to 70 y old. Per mother , mother had drug abuse issues and hence had to give away custody of her two other children when they were born. Emaya was allowed to visit them for a while , but later on they stopped that too. Mikyla likely dealing with a lot of anxiety stemming form her child hood stressors and needs to open up to some one . Mother and Yachet started yelling at each other on the phone and hence the call had to be disconnected . Discussed that this call can be made later at another time.    Principal Problem: Bipolar 1 disorder, depressed, severe (HCC) Diagnosis:   Patient Active Problem List   Diagnosis Date Noted  . Cannabis use disorder, moderate, dependence (HCC) [F12.20] 12/11/2016  . Bipolar 1 disorder, depressed, severe (HCC) [F31.4] 12/09/2016   Total Time spent with patient: 30 minutes  Past Psychiatric History: Please see H&P.   Past Medical History:  Past Medical History:  Diagnosis Date  . Bipolar 1 disorder (HCC)    History reviewed. No pertinent surgical  history. Family History:  Family History  Problem Relation Age of Onset  . Drug abuse Mother    Family Psychiatric  History: Please see H&P.  Social History:  History  Alcohol Use No     History  Drug Use  . Types: Marijuana    Social History   Social History  . Marital status: Single    Spouse name: N/A  . Number of children: N/A  . Years of education: N/A   Social History Main Topics  . Smoking status: Never Smoker  . Smokeless tobacco: Never Used  . Alcohol use No  . Drug use: Yes    Types: Marijuana  . Sexual activity: Not Asked   Other Topics Concern  . None   Social History Narrative  . None   Additional Social History:                         Sleep: Fair  Appetite:  Fair  Current Medications: Current Facility-Administered Medications  Medication Dose Route Frequency Provider Last Rate Last Dose  . acetaminophen (TYLENOL) tablet 650 mg  650 mg Oral Q4H PRN Kristeen Mans, NP      . alum & mag hydroxide-simeth (MAALOX/MYLANTA) 200-200-20 MG/5ML suspension 30 mL  30 mL Oral PRN Kristeen Mans, NP      . ARIPiprazole (ABILIFY) tablet 10 mg  10 mg Oral Daily Georgiann Cocker, MD   10  mg at 12/11/16 0816  . feeding supplement (ENSURE ENLIVE) (ENSURE ENLIVE) liquid 237 mL  237 mL Oral BID BM Rockey Situ Cobos, MD   237 mL at 12/10/16 1602  . lamoTRIgine (LAMICTAL) tablet 25 mg  25 mg Oral Daily Senai Ramnath, MD      . OLANZapine (ZYPREXA) tablet 5 mg  5 mg Oral TID PRN Jomarie Longs, MD       Or  . OLANZapine (ZYPREXA) injection 5 mg  5 mg Intramuscular TID PRN Jomarie Longs, MD      . traZODone (DESYREL) tablet 50 mg  50 mg Oral QHS PRN Kristeen Mans, NP   50 mg at 12/10/16 2154    Lab Results: No results found for this or any previous visit (from the past 48 hour(s)).  Blood Alcohol level:  Lab Results  Component Value Date   ETH <5 12/08/2016    Metabolic Disorder Labs: Lab Results  Component Value Date   HGBA1C  02/06/2008     5.3 (NOTE)   The ADA recommends the following therapeutic goals for glycemic   control related to Hgb A1C measurement:   Goal of Therapy:   < 7.0% Hgb A1C   Action Suggested:  > 8.0% Hgb A1C   Ref:  Diabetes Care, 22, Suppl. 1, 1999   MPG 111 02/06/2008   No results found for: PROLACTIN Lab Results  Component Value Date   CHOL  02/06/2008    127        ATP III CLASSIFICATION:  <200     mg/dL   Desirable  161-096  mg/dL   Borderline High  >=045    mg/dL   High   TRIG 63 40/98/1191   HDL 36 02/06/2008   CHOLHDL 3.5 02/06/2008   VLDL 13 02/06/2008   LDLCALC  02/06/2008    78        Total Cholesterol/HDL:CHD Risk Coronary Heart Disease Risk Table                     Men   Women  1/2 Average Risk   3.4   3.3    Physical Findings: AIMS: Facial and Oral Movements Muscles of Facial Expression: None, normal Lips and Perioral Area: None, normal Jaw: None, normal Tongue: None, normal,Extremity Movements Upper (arms, wrists, hands, fingers): None, normal Lower (legs, knees, ankles, toes): None, normal, Trunk Movements Neck, shoulders, hips: None, normal, Overall Severity Severity of abnormal movements (highest score from questions above): None, normal Incapacitation due to abnormal movements: None, normal Patient's awareness of abnormal movements (rate only patient's report): No Awareness, Dental Status Current problems with teeth and/or dentures?: No Does patient usually wear dentures?: No  CIWA:    COWS:     Musculoskeletal: Strength & Muscle Tone: within normal limits Gait & Station: normal Patient leans: N/A  Psychiatric Specialty Exam: Physical Exam  Nursing note and vitals reviewed.   Review of Systems  Psychiatric/Behavioral: The patient is nervous/anxious.   All other systems reviewed and are negative.   Blood pressure 116/82, pulse 88, temperature 97.8 F (36.6 C), resp. rate 16, height  (1.727 m), weight 60.3 kg (133 lb), last menstrual period 12/06/2016,  SpO2 100 %.Body mass index is 20.22 kg/m.  General Appearance: Casual  Eye Contact:  Fair  Speech:  Pressured  Volume:  Normal  Mood:  Depressed and Irritable  Affect:  Labile  Thought Process:  Goal Directed and Descriptions of Associations: Circumstantial  Orientation:  Full (  Time, Place, and Person)  Thought Content:  Rumination  Suicidal Thoughts:  No  Homicidal Thoughts:  No  Memory:  Immediate;   Fair Recent;   Fair Remote;   Fair  Judgement:  Fair  Insight:  Shallow  Psychomotor Activity:  Restlessness  Concentration:  Concentration: Fair and Attention Span: Fair  Recall:  [employer] of Knowledge:  Fair  Language:  Fair  Akathisia:  No  Handed:  Right  AIMS (if indicated):     Assets:  Communication Skills Desire for Improvement  ADL's:  Intact  Cognition:  WNL  Sleep:  Number of Hours: 6.5   Bipolar 1 disorder, depressed, severe (HCC) unstable  Will continue today 12/11/16  plan as below except where it is noted.    Treatment Plan Summary:Patient with mood lability, irritability, cannabis abuse, hx of suicide attempt, family hx of drug abuse in mother , presents today as labile and irritable . Pt today agrees to addition of new mood stabilizer. Will continue treatment. Daily contact with patient to assess and evaluate symptoms and progress in treatment, Medication management and Plan see below  Will continue Abilify 10 mg po daily for mood sx. Will start Lamictal 25 mg po daily to augment abilify . Will continue Trazodone 50 mg po qhs prn for insomnia. Will gets TSH, lipid panel, hba1c, PL. Will get EKG for qtc monitoring. Will get iron panel - has hb- low , mcv- low. CSW will work on disposition.     Kimmi Acocella, MD 12/11/2016, 1:37 PM

## 2016-12-11 NOTE — [facility] Suicide Risk Assessment (Signed)
[facility] INPATIENT:  Family/Significant Other Suicide Prevention Education  Suicide Prevention Education:  Education Completed; [name], Pt's mother [phone], has been identified by the patient as the family member/significant other with whom the patient will be residing, and identified as the person(s) who will aid the patient in the event of a mental health crisis (suicidal ideations/suicide attempt).  With written consent from the patient, the family member/significant other has been provided the following suicide prevention education, prior to the and/or following the discharge of the patient.  The suicide prevention education provided includes the following:  Suicide risk factors  Suicide prevention and interventions  National Suicide Hotline telephone number  [facility] assessment telephone number  [facility] Emergency Assistance 911  [facility] and/or Residential Mobile Crisis Unit telephone number  Request made of family/significant other to:  Remove weapons (e.g., guns, rifles, knives), all items previously/currently identified as safety concern.    Remove drugs/medications (over-the-counter, prescriptions, illicit drugs), all items previously/currently identified as a safety concern.  The family member/significant other verbalizes understanding of the suicide prevention education information provided.  The family member/significant other agrees to remove the items of safety concern listed above.  [name] 12/11/2016, 3:28 PM

## 2016-12-12 LAB — IRON AND TIBC
Iron: 59 ug/dL (ref 28–170)
Saturation Ratios: 15 % (ref 10.4–31.8)
TIBC: 389 ug/dL (ref 250–450)
UIBC: 330 ug/dL

## 2016-12-12 LAB — LIPID PANEL
CHOLESTEROL: 155 mg/dL (ref 0–200)
HDL: 54 mg/dL (ref >40)
LDL CALC: 88 mg/dL (ref 0–99)
TRIGLYCERIDES: 67 mg/dL (ref <150)
Total CHOL/HDL Ratio: 2.9 RATIO
VLDL: 13 mg/dL (ref 0–40)

## 2016-12-12 LAB — FERRITIN: Ferritin: 18 ng/mL (ref 11–307)

## 2016-12-12 LAB — TSH: TSH: 0.91 u[IU]/mL (ref 0.350–4.500)

## 2016-12-12 MED ORDER — LIDOCAINE-PRILOCAINE 2.5-2.5 % EX CREA
TOPICAL_CREAM | Freq: Once | CUTANEOUS | Status: AC
  Administered 2016-12-12: 11:00:00 via TOPICAL
  Filled 2016-12-12: qty 5

## 2016-12-12 MED ORDER — ARIPIPRAZOLE ER 400 MG IM SRER
400.0000 mg | INTRAMUSCULAR | Status: DC
  Administered 2016-12-12: 400 mg via INTRAMUSCULAR

## 2016-12-12 MED ORDER — BISACODYL 5 MG PO TBEC: 5.0000 mg | DELAYED_RELEASE_TABLET | Freq: Every day | ORAL | Status: DC | PRN

## 2016-12-12 NOTE — Progress Notes (Signed)
Recreation Therapy Notes  Animal-Assisted Activity (AAA) Program Checklist/Progress Notes Patient Eligibility Criteria Checklist & Daily Group note for Rec Tx Intervention  Date: 04.17.2018 Time: 2:45pm Location: [address]    AAA/T Program Assumption of Risk Form signed by Patient/ or Parent Legal Guardian Yes  Patient is free of allergies or sever asthma NO  Patient reports no fear of animals Yes  Patient reports no history of cruelty to animals Yes  Patient understands his/her participation is voluntary Yes  Behavioral Response: Did not attend.     Clinical Observations/Feedback: Patient discussed with MD for appropriateness in pet therapy session. Both LRT and MD agree patient is appropriate for participation. Patient offered participation in session, however politely declined due to having allergies to animals.   Marykay Lex Saint Hank, LRT/CTRS        Alaya Iverson L 12/12/2016 3:04 PM

## 2016-12-12 NOTE — Plan of Care (Signed)
Problem: Medication: Goal: Compliance with prescribed medication regimen will improve Outcome: Progressing Patient is compliant with medication regimen.   
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
## 2016-12-12 NOTE — Progress Notes (Signed)
[facility] MD Progress Note  12/12/2016 3:26 PM [name]  MRN:  [id number] Subjective: Patient states "I am better. My friend said she will take me in. I called my mom and apologized."    Objective:Patient seen and chart reviewed.Discussed patient with treatment team.  Pt this AM seen as calm, less labile , her speech is more linear . She reports anxiety as improving. She agrees that she needs to stay away from cannabis . She is hopeful to get out of the hospital soon. Per RN pt has signed a 72 hr discharge application yesterday- 12/11/16.     Principal Problem: Bipolar 1 disorder, depressed, severe (HCC) Diagnosis:   Patient Active Problem List   Diagnosis Date Noted  . Cannabis use disorder, moderate, dependence (HCC) [F12.20] 12/11/2016  . Bipolar 1 disorder, depressed, severe (HCC) [F31.4] 12/09/2016   Total Time spent with patient: 20 minutes  Past Psychiatric History: Please see H&P.   Past Medical History:  Past Medical History:  Diagnosis Date  . Bipolar 1 disorder (HCC)    History reviewed. No pertinent surgical history. Family History:  Family History  Problem Relation Age of Onset  . Drug abuse Mother    Family Psychiatric  History: Please see H&P.  Social History:  History  Alcohol Use No     History  Drug Use  . Types: Marijuana    Social History   Social History  . Marital status: Single    Spouse name: N/A  . Number of children: N/A  . Years of education: N/A   Social History Main Topics  . Smoking status: Never Smoker  . Smokeless tobacco: Never Used  . Alcohol use No  . Drug use: Yes    Types: Marijuana  . Sexual activity: Not Asked   Other Topics Concern  . None   Social History Narrative  . None   Additional Social History:                         Sleep: Fair  Appetite:  Fair  Current Medications: Current Facility-Administered Medications  Medication Dose Route Frequency Provider Last Rate Last Dose  .  acetaminophen (TYLENOL) tablet 650 mg  650 mg Oral Q4H PRN Kristeen Mans, NP      . alum & mag hydroxide-simeth (MAALOX/MYLANTA) 200-200-20 MG/5ML suspension 30 mL  30 mL Oral PRN Kristeen Mans, NP      . ARIPiprazole (ABILIFY) tablet 10 mg  10 mg Oral Daily Georgiann Cocker, MD   10 mg at 12/12/16 0836  . ARIPiprazole ER SRER 400 mg  400 mg Intramuscular Q30 days Jomarie Longs, MD   400 mg at 12/12/16 1103  . bisacodyl (DULCOLAX) EC tablet 5 mg  5 mg Oral Daily PRN Jomarie Longs, MD      . feeding supplement (ENSURE ENLIVE) (ENSURE ENLIVE) liquid 237 mL  237 mL Oral BID BM Rockey Situ Cobos, MD   237 mL at 12/12/16 1048  . lamoTRIgine (LAMICTAL) tablet 25 mg  25 mg Oral Daily Jomarie Longs, MD   25 mg at 12/12/16 0836  . OLANZapine (ZYPREXA) tablet 5 mg  5 mg Oral TID PRN Jomarie Longs, MD       Or  . OLANZapine (ZYPREXA) injection 5 mg  5 mg Intramuscular TID PRN Jomarie Longs, MD      . traZODone (DESYREL) tablet 50 mg  50 mg Oral QHS PRN Kristeen Mans, NP   50 mg  at 12/11/16 2104    Lab Results:  Results for orders placed or performed during the hospital encounter of 12/09/16 (from the past 48 hour(s))  TSH     Status: None   Collection Time: 12/12/16  6:18 AM  Result Value Ref Range   TSH 0.910 0.350 - 4.500 uIU/mL    Comment: Performed by a 3rd Generation assay with a functional sensitivity of <=0.01 uIU/mL. Performed at [facility], [address], [place], [place] [address]   Lipid panel     Status: None   Collection Time: 12/12/16  6:18 AM  Result Value Ref Range   Cholesterol 155 0 - 200 mg/dL   Triglycerides 67 <295 mg/dL   HDL 54 >62 mg/dL   Total CHOL/HDL Ratio 2.9 RATIO   VLDL 13 0 - 40 mg/dL   LDL Cholesterol 88 0 - 99 mg/dL    Comment:        Total Cholesterol/HDL:CHD Risk Coronary Heart Disease Risk Table                     Men   Women  1/2 Average Risk   3.4   3.3  Average Risk       5.0   4.4  2 X Average Risk   9.6   7.1  3 X  Average Risk  23.4   11.0        Use the calculated Patient Ratio above and the CHD Risk Table to determine the patient's CHD Risk.        ATP III CLASSIFICATION (LDL):  <100     mg/dL   Optimal  130-865  mg/dL   Near or Above                    Optimal  130-159  mg/dL   Borderline  784-696  mg/dL   High  >295     mg/dL   Very High Performed at [facility], [address] [place], [place] [address]   Iron and TIBC     Status: None   Collection Time: 12/12/16  6:18 AM  Result Value Ref Range   Iron 59 28 - 170 ug/dL   TIBC 244 010 - 272 ug/dL   Saturation Ratios 15 10.4 - 31.8 %   UIBC 330 ug/dL    Comment: Performed at [facility], [address] [place], [place] [address]  Ferritin     Status: None   Collection Time: 12/12/16  6:18 AM  Result Value Ref Range   Ferritin 18 11 - 307 ng/mL    Comment: Performed at [facility], [address], [place], [place] [address]    Blood Alcohol level:  Lab Results  Component Value Date   ETH <5 12/08/2016    Metabolic Disorder Labs: Lab Results  Component Value Date   HGBA1C  02/06/2008    5.3 (NOTE)   The ADA recommends the following therapeutic goals for glycemic   control related to Hgb A1C measurement:   Goal of Therapy:   < 7.0% Hgb A1C   Action Suggested:  > 8.0% Hgb A1C   Ref:  Diabetes Care, 22, Suppl. 1, 1999   MPG 111 02/06/2008   No results found for: PROLACTIN Lab Results  Component Value Date   CHOL 155 12/12/2016   TRIG 67 12/12/2016   HDL 54 12/12/2016   CHOLHDL 2.9 12/12/2016   VLDL 13  12/12/2016   LDLCALC 88 12/12/2016   LDLCALC  02/06/2008    78        Total Cholesterol/HDL:CHD Risk Coronary Heart Disease Risk Table                     Men   Women  1/2 Average Risk   3.4   3.3    Physical Findings: AIMS: Facial and Oral Movements Muscles of Facial Expression: None, normal Lips and Perioral Area: None, normal Jaw: None, normal Tongue: None, normal,Extremity  Movements Upper (arms, wrists, hands, fingers): None, normal Lower (legs, knees, ankles, toes): None, normal, Trunk Movements Neck, shoulders, hips: None, normal, Overall Severity Severity of abnormal movements (highest score from questions above): None, normal Incapacitation due to abnormal movements: None, normal Patient's awareness of abnormal movements (rate only patient's report): No Awareness, Dental Status Current problems with teeth and/or dentures?: No Does patient usually wear dentures?: No  CIWA:    COWS:     Musculoskeletal: Strength & Muscle Tone: within normal limits Gait & Station: normal Patient leans: N/A  Psychiatric Specialty Exam: Physical Exam  Nursing note and vitals reviewed.   Review of Systems  Psychiatric/Behavioral: The patient is nervous/anxious.   All other systems reviewed and are negative.   Blood pressure 116/82, pulse 88, temperature 97.8 F (36.6 C), resp. rate 16, height  (1.727 m), weight 60.3 kg (133 lb), last menstrual period 12/06/2016, SpO2 100 %.Body mass index is 20.22 kg/m.  General Appearance: Casual  Eye Contact:  Fair  Speech:  Pressured  Volume:  Normal  Mood:  Depressed  Affect:  Congruent  Thought Process:  Goal Directed and Descriptions of Associations: Circumstantial  Orientation:  Full (Time, Place, and Person)  Thought Content:  Rumination  Suicidal Thoughts:  No  Homicidal Thoughts:  No  Memory:  Immediate;   Fair Recent;   Fair Remote;   Fair  Judgement:  Fair  Insight:  Shallow  Psychomotor Activity:  Normal  Concentration:  Concentration: Fair and Attention Span: Fair  Recall:  [employer] of Knowledge:  Fair  Language:  Fair  Akathisia:  No  Handed:  Right  AIMS (if indicated):     Assets:  Communication Skills Desire for Improvement  ADL's:  Intact  Cognition:  WNL  Sleep:  Number of Hours: 6.75    12/11/16. With patient's consent contacted mother - Ms. Inetta Fermo - who reported pt as  argumentative and disrespectful, lied to her about her job , could not pay bills and hence was kicked out. Mother states she lives with someone else and the place is not hers , hence she could not let her stay there anymore. Mother believes patient is dealing with a lot of conflicts , she was being followed by [employer] in the past , from 53 to 78 y old. Per mother , mother had drug abuse issues and hence had to give away custody of her two other children when they were born. Matisyn was allowed to visit them for a while , but later on they stopped that too. Iviana likely dealing with a lot of anxiety stemming form her child hood stressors and needs to open up to some one . Mother and Laurali started yelling at each other on the phone and hence the call had to be disconnected . Discussed that this call can be made later at another time.      Bipolar 1 disorder, depressed, severe (HCC) improving  Will continue today 12/12/16  plan as below except where it is noted.       Treatment Plan Summary:Patient seen as improving , she agrees to stay away from cannabis , she continues to work on conflicts with her mom , she agrees to a LAI , will offer the same.  Daily contact with patient to assess and evaluate symptoms and progress in treatment, Medication management and Plan see below  Will continue Abilify 10 mg po daily for mood sx. Abilify Maintena IM 400 mg - first dose 12/12/16 , repeat q30 days. Will continue Lamictal 25 mg po daily to augment abilify . Will continue Trazodone 50 mg po qhs prn for insomnia. Reviewed labs - tsh - wnl, lipid panel - wnl , pending hbaic. EKG for qtc reviewed - wnl. Iron panel - wnl. CSW will work on disposition.Patient signed 72 hr discharge application on 12/11/16.     Dawnita Molner, MD 12/12/2016, 3:26 PM

## 2016-12-12 NOTE — Progress Notes (Signed)
D: Pt at the time of assessment denied SI, HI, depression, anxiety, pain or AVH; states, "just spoke to my childhood friend and her GM and they say I can come stay with them; I feel very relief that I now have a place to go." Pt signed a 72hr request for discharge form on 12/11/2016 . Pt observed interacting with peer in the dayroom. Pt remained flat. A: Medications offered as prescribed. All patient's questions and concerns were addressed. Support, encouragement, and safe environment provided. 15-minute safety checks continue. R: Pt was med compliant. Pt attended wrap-up group. Safety checks continue

## 2016-12-12 NOTE — Progress Notes (Signed)
Recreation Therapy Notes  Date: 12/12/16 Time: 1000 Location: 300 Hall Dayroom   Group Topic: Communication, Team Building, Problem Solving  Goal Area(s) Addresses:  Patient will effectively work with peer towards shared goal.  Patient will identify skill used to make activity successful.  Patient will identify how skills used during activity can be used to reach post d/c goals.   Behavioral Response: Engaged  Intervention: STEM Activity   Activity: [employer]. Patients were provided the following materials: 5 drinking straws, 5 rubber bands, 5 paper clips, 2 index cards, 2 drinking cups, and 2 toilet paper rolls. Using the provided materials patients were asked to build a launching mechanisms to launch a ping pong ball approximately 12 feet. Patients were divided into teams of 3-5.   Education: Pharmacist, community, Building control surveyor.   Education Outcome: Acknowledges education/In group clarification offered/Needs additional education.   Clinical Observations/Feedback: Pt was active and worked well with her group to come up with a concept they felt would allow them to be successful.  Pt stated one of the skills the group used was strategy.  Pt also stated the skills from the activity would allow her to "open up to people and tell them how I feel".   Caroll Rancher, LRT/CTRS         Caroll Rancher A 12/12/2016 11:38 AM

## 2016-12-12 NOTE — Progress Notes (Signed)
Psychoeducational Group Note  Date:  12/12/2016 Time:  2114  Group Topic/Focus:  Wrap-Up Group:   The focus of this group is to help patients review their daily goal of treatment and discuss progress on daily workbooks.  Participation Level: Did Not Attend  Participation Quality:  Not Applicable  Affect:  Not Applicable  Cognitive:  Not Applicable  Insight:  Not Applicable  Engagement in Group: Not Applicable  Additional Comments:  The patient did not attend group this evening.   Hazle Coca S 12/12/2016, 9:13 PM

## 2016-12-12 NOTE — Progress Notes (Signed)
Recreation Therapy Notes  INPATIENT RECREATION THERAPY ASSESSMENT  Patient Details Name: [name] MRN: [id number] DOB: 1996-01-09 Today's Date: 12/12/2016  Patient Stressors: Family, Other (Comment) (Housing)  Pt stated she is here for being suicidal.  Coping Skills:   Isolate, Substance Abuse, Avoidance, Art/Dance, Talking, Music, Sports  Personal Challenges: Anger, Concentration, Decision-Making, Expressing Yourself, Relationships, Stress Management, Substance Abuse, Trusting Others, Work Nutritional therapist (2+):  Games - Clinical cytogeneticist games, Sports - [employer], Art - Armed forces logistics/support/administrative officer Resources:  Yes  Community Resources:  Park, Engineer, petroleum  Current Use: Yes  Patient Strengths:  Facilities manager; Being me  Patient Identified Areas of Improvement:  Attitude; Expressing feelings  Current Recreation Participation:  Everyday  Patient Goal for Hospitalization:  "Open up in groups"  [place] of Residence:  [place] of Residence:  [place]  Current [place] (including self-harm):  No  Current HI:  No  Consent to Intern Participation: N/A   Caroll Rancher, LRT/CTRS  Caroll Rancher A 12/12/2016, 12:07 PM

## 2016-12-12 NOTE — Progress Notes (Signed)
DAR NOTE: Patient presents with animated affect and mood.  Denies pain, auditory and visual hallucinations.  Described energy level as low and concentration as good.  Rates depression at 0, hopelessness at 0, and anxiety at 0.  Maintained on routine safety checks.  Medications given as prescribed.  Support and encouragement offered as needed.  Attended group and participated.  States goal for today is "stay positive."  Patient observed socializing with peers in the dayroom.  Abilify maintenance injection given.  No adverse reaction noted.  Patient verbalizes readiness for discharge.

## 2016-12-13 LAB — HEMOGLOBIN A1C
Hgb A1c MFr Bld: 5 % (ref 4.8–5.6)
Mean Plasma Glucose: 97 mg/dL

## 2016-12-13 LAB — PROLACTIN: Prolactin: 9.1 ng/mL (ref 4.8–23.3)

## 2016-12-13 NOTE — Progress Notes (Signed)
[facility] MD Progress Note  12/13/2016 1:58 PM [name]  MRN:  [id number]  Subjective: Patient states "I'm doing better. I'm opening up more during group sessions. I'm getting a lot of good feed backs. My depression is improving. Sleep has improved too".    Objective:Patient seen and chart reviewed. Discussed patient with treatment team.  Pt this AM seen as calm, quiet, her speech is more linear . She reports anxiety/depression as improving. She agrees that she needs to stay away from cannabis . She is hopeful to get out of the hospital soon. Will be moving in with a friend. Per RN pt has signed a 72 hr discharge application on- 12/11/16.  Principal Problem: Bipolar 1 disorder, depressed, severe (HCC)  Diagnosis:   Patient Active Problem List   Diagnosis Date Noted  . Cannabis use disorder, moderate, dependence (HCC) [F12.20] 12/11/2016  . Bipolar 1 disorder, depressed, severe (HCC) [F31.4] 12/09/2016   Total Time spent with patient: 15 minutes  Past Psychiatric History: Please see H&P.   Past Medical History:  Past Medical History:  Diagnosis Date  . Bipolar 1 disorder (HCC)    History reviewed. No pertinent surgical history. Family History:  Family History  Problem Relation Age of Onset  . Drug abuse Mother    Family Psychiatric  History: Please see H&P.  Social History:  History  Alcohol Use No     History  Drug Use  . Types: Marijuana    Social History   Social History  . Marital status: Single    Spouse name: N/A  . Number of children: N/A  . Years of education: N/A   Social History Main Topics  . Smoking status: Never Smoker  . Smokeless tobacco: Never Used  . Alcohol use No  . Drug use: Yes    Types: Marijuana  . Sexual activity: Not Asked   Other Topics Concern  . None   Social History Narrative  . None   Additional Social History:   Sleep: Good  Appetite:  Fair  Current Medications: Current Facility-Administered Medications   Medication Dose Route Frequency Provider Last Rate Last Dose  . acetaminophen (TYLENOL) tablet 650 mg  650 mg Oral Q4H PRN Kristeen Mans, NP   650 mg at 12/13/16 0825  . alum & mag hydroxide-simeth (MAALOX/MYLANTA) 200-200-20 MG/5ML suspension 30 mL  30 mL Oral PRN Kristeen Mans, NP      . ARIPiprazole (ABILIFY) tablet 10 mg  10 mg Oral Daily Georgiann Cocker, MD   10 mg at 12/13/16 0824  . ARIPiprazole ER SRER 400 mg  400 mg Intramuscular Q30 days Jomarie Longs, MD   400 mg at 12/12/16 1103  . bisacodyl (DULCOLAX) EC tablet 5 mg  5 mg Oral Daily PRN Jomarie Longs, MD      . feeding supplement (ENSURE ENLIVE) (ENSURE ENLIVE) liquid 237 mL  237 mL Oral BID BM Rockey Situ Cobos, MD   237 mL at 12/12/16 1048  . lamoTRIgine (LAMICTAL) tablet 25 mg  25 mg Oral Daily Jomarie Longs, MD   25 mg at 12/13/16 0824  . OLANZapine (ZYPREXA) tablet 5 mg  5 mg Oral TID PRN Jomarie Longs, MD       Or  . OLANZapine (ZYPREXA) injection 5 mg  5 mg Intramuscular TID PRN Jomarie Longs, MD      . traZODone (DESYREL) tablet 50 mg  50 mg Oral QHS PRN Kristeen Mans, NP   50 mg at 12/12/16 2127  Lab Results:  Results for orders placed or performed during the hospital encounter of 12/09/16 (from the past 48 hour(s))  TSH     Status: None   Collection Time: 12/12/16  6:18 AM  Result Value Ref Range   TSH 0.910 0.350 - 4.500 uIU/mL    Comment: Performed by a 3rd Generation assay with a functional sensitivity of <=0.01 uIU/mL. Performed at [facility], [address] [place], [place] [address]   Lipid panel     Status: None   Collection Time: 12/12/16  6:18 AM  Result Value Ref Range   Cholesterol 155 0 - 200 mg/dL   Triglycerides 67 <604 mg/dL   HDL 54 >54 mg/dL   Total CHOL/HDL Ratio 2.9 RATIO   VLDL 13 0 - 40 mg/dL   LDL Cholesterol 88 0 - 99 mg/dL    Comment:        Total Cholesterol/HDL:CHD Risk Coronary Heart Disease Risk Table                     Men   Women  1/2 Average  Risk   3.4   3.3  Average Risk       5.0   4.4  2 X Average Risk   9.6   7.1  3 X Average Risk  23.4   11.0        Use the calculated Patient Ratio above and the CHD Risk Table to determine the patient's CHD Risk.        ATP III CLASSIFICATION (LDL):  <100     mg/dL   Optimal  098-119  mg/dL   Near or Above                    Optimal  130-159  mg/dL   Borderline  147-829  mg/dL   High  >562     mg/dL   Very High Performed at [facility], [address], [place], [place] [address]   Hemoglobin A1c     Status: None   Collection Time: 12/12/16  6:18 AM  Result Value Ref Range   Hgb A1c MFr Bld 5.0 4.8 - 5.6 %    Comment: (NOTE)         Pre-diabetes: 5.7 - 6.4         Diabetes: >6.4         Glycemic control for adults with diabetes: <7.0    Mean Plasma Glucose 97 mg/dL    Comment: (NOTE) Performed At: [facility] [address] [place], [place] [id number] Mila Homer MD [id number] Performed at [facility], [address], [place], [place] [address]   Prolactin     Status: None   Collection Time: 12/12/16  6:18 AM  Result Value Ref Range   Prolactin 9.1 4.8 - 23.3 ng/mL    Comment: (NOTE) Performed At: [facility] [address] [place], [place] [id number] Mila Homer MD [id number] Performed at [facility], [address], [place], [place] [address]   Iron and TIBC     Status: None   Collection Time: 12/12/16  6:18 AM  Result Value Ref Range   Iron 59 28 - 170 ug/dL   TIBC 884 166 - 063 ug/dL   Saturation Ratios 15 10.4 - 31.8 %   UIBC 330 ug/dL    Comment: Performed at [facility], [address] [place], [place] [address]  Ferritin     Status: None   Collection Time: 12/12/16  6:18 AM  Result Value Ref Range   Ferritin 18 11 - 307 ng/mL    Comment: Performed at [facility], [address] [place], [place] [address]    Blood Alcohol level:  Lab Results  Component  Value Date   ETH <5 12/08/2016    Metabolic Disorder Labs: Lab Results  Component Value Date   HGBA1C 5.0 12/12/2016   MPG 97 12/12/2016   MPG 111 02/06/2008   Lab Results  Component Value Date   PROLACTIN 9.1 12/12/2016   Lab Results  Component Value Date   CHOL 155 12/12/2016   TRIG 67 12/12/2016   HDL 54 12/12/2016   CHOLHDL 2.9 12/12/2016   VLDL 13 12/12/2016   LDLCALC 88 12/12/2016   LDLCALC  02/06/2008    78        Total Cholesterol/HDL:CHD Risk Coronary Heart Disease Risk Table                     Men   Women  1/2 Average Risk   3.4   3.3   Physical Findings: AIMS: Facial and Oral Movements Muscles of Facial Expression: None, normal Lips and Perioral Area: None, normal Jaw: None, normal Tongue: None, normal,Extremity Movements Upper (arms, wrists, hands, fingers): None, normal Lower (legs, knees, ankles, toes): None, normal, Trunk Movements Neck, shoulders, hips: None, normal, Overall Severity Severity of abnormal movements (highest score from questions above): None, normal Incapacitation due to abnormal movements: None, normal Patient's awareness of abnormal movements (rate only patient's report): No Awareness, Dental Status Current problems with teeth and/or dentures?: No Does patient usually wear dentures?: No  CIWA:    COWS:     Musculoskeletal: Strength & Muscle Tone: within normal limits Gait & Station: normal Patient leans: N/A  Psychiatric Specialty Exam: Physical Exam  Nursing note and vitals reviewed.   Review of Systems  Psychiatric/Behavioral: The patient is nervous/anxious.   All other systems reviewed and are negative.   Blood pressure 99/62, pulse 79, temperature 97.8 F (36.6 C), resp. rate 16, height  (1.727 m), weight 60.3 kg (133 lb), last menstrual period 12/06/2016, SpO2 100 %.Body mass index is 20.22 kg/m.  General Appearance: Casual  Eye Contact:  Good  Speech:  Clear and Coherent and Normal Rate  Volume:  Normal   Mood:  "Improving"  Affect:  Appropriate and Congruent  Thought Process:  Goal Directed and Descriptions of Associations: Intact  Orientation:  Full (Time, Place, and Person)  Thought Content:  Rumination, denies any hallucinations, delusions or paranoia.  Suicidal Thoughts:  No, denies any thoughts, plans or intent.  Homicidal Thoughts:  No  Memory:  Immediate;   Fair Recent;   Fair Remote;   Fair  Judgement:  Fair  Insight:  Shallow  Psychomotor Activity:  Normal  Concentration:  Concentration: Fair and Attention Span: Fair  Recall:  [employer] of Knowledge:  Fair  Language:  Fair  Akathisia:  No  Handed:  Right  AIMS (if indicated):     Assets:  Communication Skills Desire for Improvement  ADL's:  Intact  Cognition:  WNL  Sleep:  Number of Hours: 6   12/11/16. With patient's consent contacted mother - Ms. Inetta Fermo - who reported pt as argumentative and disrespectful, lied to her about her job , could not pay bills and hence was kicked out. Mother states she lives with someone else  and the place is not hers, hence she could not let her stay there anymore. Mother believes patient is dealing with a lot of conflicts, she was being followed by [place] in the past , from 23 to 83 y old. Per mother , mother had drug abuse issues and hence had to give away custody of her two other children when they were born. Lysandra was allowed to visit them for a while, but later on they stopped that too. Morene likely dealing with a lot of anxiety stemming from her child hood stressors and needs to open up to some one. Mother and Michaela started yelling at each other on the phone and hence the call had to be disconnected. Discussed that this call can be made later at another time.  Bipolar 1 disorder, depressed, severe (HCC) improving  Will continue today 12/13/16  plan as below except where it is noted.  Treatment Plan Summary: Patient seen as improving, she agrees to stay away  from cannabis, she continues to work on conflicts with her mom, she agrees to a LAI, will offer the same.  Daily contact with patient to assess and evaluate symptoms and progress in treatment, Medication management and Plan see below  Will continue Abilify 10 mg po daily for mood sx. Will continue Abilify Maintena IM 400 mg - first dose 12/12/16, repeat q30 days for mood stability. Will continue Lamictal 25 mg po daily to augment abilify . Will continue Trazodone 50 mg po qhs prn for insomnia. Reviewed labs - tsh - wnl, lipid panel - wnl, HgbA1C 5.0. EKG for qtc reviewed - wnl. Iron panel - wnl. CSW will work on disposition.Patient signed 72 hr discharge application on 12/11/16.  Sanjuana Kava, NP, PMHNP, FNP-BC 12/13/2016, 1:58 PMPatient ID: [name], female   DOB: [birth year], 21 y.o.   MRN: [id number] Agree with NP Progress Note

## 2016-12-13 NOTE — [facility] Group Notes (Signed)
[facility] Group Notes:  (Counselor/Nursing/MHT/Case Management/Adjunct)  12/13/2016 1:15PM  Type of Therapy:  Group Therapy  Participation Level:  Active  Participation Quality:  Appropriate  Affect:  Flat  Cognitive:  Oriented  Insight:  Improving  Engagement in Group:  Limited  Engagement in Therapy:  Limited  Modes of Intervention:  Discussion, Exploration and Socialization  Summary of Progress/Problems: The topic for group was balance in life.  Pt participated in the discussion about when their life was in balance and out of balance and how this feels.  Pt discussed ways to get back in balance and short term goals they can work on to get where they want to be. "I feel like I am emotionally balanced today.  Every group that I come to, I practice expressing myself a little more, and I feel like I am doing better than when I came in." Talked about family as people who make her laugh and elevate her mood.  Stayed the entire time, engaged throughout.   Daryel Gerald B 12/13/2016 1:35 PM

## 2016-12-13 NOTE — Progress Notes (Signed)
D   Pt is pleasant and appropriate   She interacts well with others and is compliant with treatment    She attends group and participates appropriately A    Verbal support given   Medications administered and effectiveness monitored   Q 15 min checks R     Pt is safe at present time

## 2016-12-13 NOTE — Progress Notes (Signed)
Recreation Therapy Notes  Date: 12/13/16 Time: 1000 Location: 300 Hall Dayroom  Group Topic: Anger Management  Goal Area(s) Addresses:  Patient will identify triggers for anger.  Patient will identify physical reaction to anger.   Patient will identify benefit of using coping skills when angry.  Behavioral Response: Engaged  Intervention: Worksheets, pencils  Activity: Anger Umbrella.  Patients were given a worksheet with an umbrella on it.  Patients were to identify the situations that cause them to get angry and write them inside the umbrella.  Patients were to then identify coping skills they could use to deal with their anger and write it on the outside of the umbrella.  Education:Anger Management, Discharge Planning   Education Outcome: Acknowledges education/In group clarification offered/Needs additional education.   Clinical Observations/Feedback: Pt stated when she gets angry, she wants to fight.  Pt stated being violated, being mean to people and bullying makes her angry. Pt expressed she deals with her anger by talking to someone, walking away or listening to music.  Pt expressed whether her coping skills help her depends on the situation.   Caroll Rancher, LRT/CTRS         Caroll Rancher A 12/13/2016 12:05 PM

## 2016-12-13 NOTE — Progress Notes (Signed)
D: Patient up and visible in the milieu. Spoke with patient 1:1. Rating depression, hopelessness and anxiety all at a 0/10. Rates sleep as good, appetite as fair, energy as normal and concentration as good. Patient's affect appropriate with congruent mood. States goal for today is to "stay happy, smile and be me." Complaining of L leg pain which she attributes to walking without shoes. Patient is restless and ambulates frequently. No other physical problems.   A: Medicated per orders, prn tylenol given. Emotional support offered and self inventory reviewed. Encouraged completion of Suicide Safety Plan. Discussed POC with MD, SW.   R: Patient verbalizes understanding of POC. On reassess, patient's pain is a 2/10. Patient denies SI/HI and remains safe on level III obs.

## 2016-12-13 NOTE — Plan of Care (Signed)
Problem: Education: Goal: Verbalization of understanding the information provided will improve Outcome: Progressing Patient verbalizes understanding of information, education provided.  Problem: Safety: Goal: Periods of time without injury will increase Outcome: Progressing Patient has not engaged in self harm, denies SI.   

## 2016-12-13 NOTE — Progress Notes (Signed)
[facility] Group Notes:  (Nursing/MHT/Case Management/Adjunct)  Date:  12/13/2016  Time:  8:59 PM  Type of Therapy:  Psychoeducational Skills  Participation Level:  Active  Participation Quality:  Appropriate  Affect:  Appropriate  Cognitive:  Appropriate  Insight:  Improving  Engagement in Group:  Engaged  Modes of Intervention:  Education  Summary of Progress/Problems: The patient expressed in group that she had a good day and that it was "joyful". She also mentioned that she had a good family visit. As for the theme of the day, her wellness strategy will be to achieve a "state of mind" by making better decisions for herself.   [name] 12/13/2016, 8:59 PM

## 2016-12-13 NOTE — Progress Notes (Signed)
D: Pt at the time of assessment denied SI, HI, depression, anxiety, pain or AVH; states, "I am very excited, I hope I will be able to go home tomorrow." Pt observed interacting with peer in the dayroom. Pt remained calm and cooperative. A: Medications offered as prescribed. All patient's questions and concerns were addressed. Support, encouragement, and safe environment provided. 15-minute safety checks continue. R: Pt was med compliant. Pt attended wrap-up group. Safety checks continue

## 2016-12-14 MED ORDER — ARIPIPRAZOLE ER 400 MG IM SRER: 400.0000 mg | INTRAMUSCULAR | 0 refills | Status: DC

## 2016-12-14 MED ORDER — LAMOTRIGINE 25 MG PO TABS: 25.0000 mg | ORAL_TABLET | Freq: Every day | ORAL | 0 refills | Status: DC

## 2016-12-14 MED ORDER — TRAZODONE HCL 50 MG PO TABS: 50.0000 mg | ORAL_TABLET | Freq: Every evening | ORAL | 0 refills | Status: DC | PRN

## 2016-12-14 MED ORDER — BISACODYL 5 MG PO TBEC: 5.0000 mg | DELAYED_RELEASE_TABLET | Freq: Every day | ORAL | 0 refills | Status: DC | PRN

## 2016-12-14 MED ORDER — ARIPIPRAZOLE 10 MG PO TABS: 10.0000 mg | ORAL_TABLET | Freq: Every day | ORAL | 0 refills | Status: DC

## 2016-12-14 NOTE — Progress Notes (Signed)
Patient verbalizes readiness for discharge. Follow up plan explained, AVS, transition record and SRA given along with prescriptions. All belongings returned. Patient verbalizes understanding. Denies SI/HI and assures this Clinical research associate she will seek assistance should that change. Patient discharged ambulatory and in stable condition with bus pass.

## 2016-12-14 NOTE — Progress Notes (Signed)
Recreation Therapy Notes  Date: 12/14/16 Time: 1000 Location: 300 Hall Dayroom  Group Topic: Wellness  Goal Area(s) Addresses:  Patient will define components of whole wellness. Patient will verbalize benefit of whole wellness.  Behavioral Response: Engaged  Intervention:  Chairs, Giant beach ball  Activity: Keep It Contractor.  Patients were placed in a circle.  Patients were to pass the beach ball back and forth to each other.  The ball could bounce off the ground but could not come to a complete stop.  LRT would keep count of how many times the ball was hit.  If the ball comes to a stop, the count starts over.  Education: Wellness, Building control surveyor.   Education Outcome: Acknowledges education/In group clarification offered/Needs additional education.   Clinical Observations/Feedback: Pt was engaged and active throughout group.  Pt was social with peers.  Pt expressed she liked the activity and it was a good stress reliever.    Caroll Rancher, LRT/CTRS         Caroll Rancher A 12/14/2016 11:43 AM

## 2016-12-14 NOTE — Progress Notes (Signed)
  [facility] Adult Case Management Discharge Plan :  Will you be returning to the same living situation after discharge:  Yes,  with family at d/c, and then with a friend in [place]  At discharge, do you have transportation home?: Yes,  bus pass Do you have the ability to pay for your medications: Yes,  insurance   Release of information consent forms completed and in the chart;  Patient's signature needed at discharge.  Patient to Follow up at: Follow-up Information    [facility] Follow up on 12/18/2016.   Why:  Monday at 2:30 with Nyra Jabs Contact information: [address] [place] [phone]          Next level of care provider has access to [facility] Link:yes  Safety Planning and Suicide Prevention discussed: Yes,  with nurse   Have you used any form of tobacco in the last 30 days? (Cigarettes, Smokeless Tobacco, Cigars, and/or Pipes): No  Has patient been referred to the Quitline?: N/A patient is not a smoker   Patient has been referred for addiction treatment: N/A  Baldo Daub 12/14/2016, 11:22 AM

## 2016-12-14 NOTE — Discharge Summary (Signed)
Physician Discharge Summary Note  Patient:  [name] is an 21 y.o., female MRN:  [id number] DOB:  24-Jan-1996 Patient phone:  [phone] (home)  Patient address:   [address] [place] [address],  Total Time spent with patient: Greater than 30 minutes  Date of Admission:  12/09/2016 Date of Discharge: 12-13-17  Reason for Admission: Suicidal ideations.  Principal Problem: Bipolar 1 disorder, depressed, severe (HCC)  Discharge Diagnoses: Patient Active Problem List   Diagnosis Date Noted  . Cannabis use disorder, moderate, dependence (HCC) [F12.20] 12/11/2016  . Bipolar 1 disorder, depressed, severe (HCC) [F31.4] 12/09/2016   Past Psychiatric History: Bipolar disorder, Cannabis use disorder  Past Medical History:  Past Medical History:  Diagnosis Date  . Bipolar 1 disorder (HCC)    History reviewed. No pertinent surgical history. Family History:  Family History  Problem Relation Age of Onset  . Drug abuse Mother    Family Psychiatric  History: See H&P Social History:  History  Alcohol Use No     History  Drug Use  . Types: Marijuana    Social History   Social History  . Marital status: Single    Spouse name: N/A  . Number of children: N/A  . Years of education: N/A   Social History Main Topics  . Smoking status: Never Smoker  . Smokeless tobacco: Never Used  . Alcohol use No  . Drug use: Yes    Types: Marijuana  . Sexual activity: Not Asked   Other Topics Concern  . None   Social History Narrative  . None   Hospital Course: 21 yo AAF lives with her mom but was recently kicked out. Brought to the ER by the police. Reports worsening thoughts of suicide. Has been there for the past three weeks. She lost her job about the same time. Her mom kicked her out two days ago as she could no longer afford rent. Patient reports thoughts of jumping in front of a car. UDS was positive for THC.  At interview, patient reports that she quit her  job after being there for a month. She felt she was being overworked. Says her mom was upset with her impulsive decision. Says she kicked her out of the house. Patient states that she slept at the gymn the first night. Says she tried to plead with her mom to let her back in. Says her mom would not answer her calls. Patient says she felt desperate and was having thoughts that life was not worth living if she continued that way. Says she called the police herself.  Patient reports easy irritability and impulsivity. She reports heavy use of THC. Says her girlfriend just enjoys them chilling with THC. Says it takes her away from reality but she gets anxious once reality sets in again. Missing out on her responsibilities and the consequences as main factor that drives her anxiety. Patient states that otherwise, she is pleasant. She reports difficulty getting into sleep. She good energy levels despite limited sleep. No hallucination in any modality. No persecution or paranoia. No grandiose delusion. No thoughts of harming anyone. No other substance use other than THC.  Currently she is not having suicidal thoughts. She hopes to use this opportunity to get her life back on track. She was started on Aripiprazole. She is tolerating it well and plans to stay on it.   [name] was admitted to the hospital for worsening symptoms of depression & crisis management due to suicidal thoughts. She  cited as her stressors as being kicked out the home she was sharing with her mother & having to quit her job after she felt over worked. She was admitted to the hospital for mood stabilization treatments.   After her admission assessment, [name]'s presenting symptoms were identified. The medication regimen targeting those symptoms were initiated. She was medicated & discharged on; Abilify 10 mg for mood control, Abilify ER injectable 400 mg IM Q 30 days for mood control, Lamictal 25 mg for mood stabilization & Trazodone 50 mg for  insomnia. Her other pre-existing medical problems were identified & treated. [name] was enrolled & participated in the group counseling sessions being offered & held on this unit. She learned coping skills that should help her cope better to maintain mood stability after discharge.  During the course of her hospitalization, [name]'s improvement was monitored by observation & her daily report of symptom reduction noted.  Her emotional and mental status were monitored by daily self-inventory reports completed by her & the clinical staff. She was evaluated by the treatment team for mood stability and plans for continued recovery after discharge. [name]'s motivation was an integral factor in her mood stability. She was offered further treatment options upon discharge as noted below.   Upon discharge, [name] was both mentally and medically stable. She denies suicidal/homicidal ideations, auditory/visual/tactile hallucinations, delusional thoughts or paranoia. She received from the pharmacy, a 7 days worth, supply samples of her [facility] discharge medications. She left [facility] with all belongings in no distress. Transportation per [employer], [facility] assisted with bus pass.  Physical Findings: AIMS: Facial and Oral Movements Muscles of Facial Expression: None, normal Lips and Perioral Area: None, normal Jaw: None, normal Tongue: None, normal,Extremity Movements Upper (arms, wrists, hands, fingers): None, normal Lower (legs, knees, ankles, toes): None, normal, Trunk Movements Neck, shoulders, hips: None, normal, Overall Severity Severity of abnormal movements (highest score from questions above): None, normal Incapacitation due to abnormal movements: None, normal Patient's awareness of abnormal movements (rate only patient's report): No Awareness, Dental Status Current problems with teeth and/or dentures?: No Does patient usually wear dentures?: No  CIWA:    COWS:     Musculoskeletal: Strength & Muscle  Tone: within normal limits Gait & Station: normal Patient leans: N/A  Psychiatric Specialty Exam: Physical Exam  Constitutional: She appears well-developed.  HENT:  Head: Normocephalic.  Eyes: Pupils are equal, round, and reactive to light.  Neck: Normal range of motion.  Cardiovascular: Normal rate.   Respiratory: Effort normal.  GI: Soft.  Genitourinary:  Genitourinary Comments: Deferred  Musculoskeletal: Normal range of motion.  Neurological: She is alert.  Skin: Skin is warm.    Review of Systems  Constitutional: Negative.   HENT: Negative.   Eyes: Negative.   Respiratory: Negative.   Cardiovascular: Negative.   Gastrointestinal: Negative.   Genitourinary: Negative.   Musculoskeletal: Negative.   Skin: Negative.   Neurological: Negative.   Endo/Heme/Allergies: Negative.   Psychiatric/Behavioral: Positive for depression (Stable) and substance abuse (Hx. Cannabis use disorder). Negative for hallucinations, memory loss and suicidal ideas. The patient has insomnia (Stable). The patient is not nervous/anxious.     Blood pressure 103/67, pulse 98, temperature 98.6 F (37 C), temperature source Oral, resp. rate 16, height  (1.727 m), weight 60.3 kg (133 lb), last menstrual period 12/06/2016, SpO2 100 %.Body mass index is 20.22 kg/m.  See Md's SRA   Have you used any form of tobacco in the last 30 days? (Cigarettes, Smokeless Tobacco, Cigars, and/or  Pipes): No  Has this patient used any form of tobacco in the last 30 days? (Cigarettes, Smokeless Tobacco, Cigars, and/or Pipes): No  Blood Alcohol level:  Lab Results  Component Value Date   ETH <5 12/08/2016   Metabolic Disorder Labs:  Lab Results  Component Value Date   HGBA1C 5.0 12/12/2016   MPG 97 12/12/2016   MPG 111 02/06/2008   Lab Results  Component Value Date   PROLACTIN 9.1 12/12/2016   Lab Results  Component Value Date   CHOL 155 12/12/2016   TRIG 67 12/12/2016   HDL 54 12/12/2016   CHOLHDL 2.9  12/12/2016   VLDL 13 12/12/2016   LDLCALC 88 12/12/2016   LDLCALC  02/06/2008    78        Total Cholesterol/HDL:CHD Risk Coronary Heart Disease Risk Table                     Men   Women  1/2 Average Risk   3.4   3.3   See Psychiatric Specialty Exam and Suicide Risk Assessment completed by Attending Physician prior to discharge.  Discharge destination:  Home  Is patient on multiple antipsychotic therapies at discharge:  No   Has Patient had three or more failed trials of antipsychotic monotherapy by history:  No  Recommended Plan for Multiple Antipsychotic Therapies: NA  Allergies as of 12/14/2016   No Known Allergies     Medication List    STOP taking these medications   ibuprofen 200 MG tablet Commonly known as:  ADVIL,MOTRIN     TAKE these medications     Indication  ARIPiprazole 10 MG tablet Commonly known as:  ABILIFY Take 1 tablet (10 mg total) by mouth daily. For mood control Start taking on:  12/15/2016  Indication:  Mood control   ARIPiprazole ER 400 MG Srer Inject 400 mg into the muscle every 30 (thirty) days. (Due on 01-11-17): For mood control Start taking on:  01/11/2017  Indication:  Mood control   bisacodyl 5 MG EC tablet Commonly known as:  DULCOLAX Take 1 tablet (5 mg total) by mouth daily as needed for moderate constipation. (May purchase from OTC)): For constipation  Indication:  Constipation   lamoTRIgine 25 MG tablet Commonly known as:  LAMICTAL Take 1 tablet (25 mg total) by mouth daily. For mood stabilization Start taking on:  12/15/2016  Indication:  Mood stabilization   traZODone 50 MG tablet Commonly known as:  DESYREL Take 1 tablet (50 mg total) by mouth at bedtime as needed for sleep.  Indication:  Trouble Sleeping      Follow-up Information    [facility] Follow up on 12/18/2016.   Why:  Monday at 2:30 with Nyra Jabs Contact information: [address] [place] [phone]         Follow-up recommendations:  Activity:  As tolerated Diet: As recommended by your primary care doctor. Keep all scheduled follow-up appointments as recommended.   Comments: Patient is instructed prior to discharge to: Take all medications as prescribed by his/her mental healthcare provider. Report any adverse effects and or reactions from the medicines to his/her outpatient provider promptly. Patient has been instructed & cautioned: To not engage in alcohol and or illegal drug use while on prescription medicines. In the event of worsening symptoms, patient is instructed to call the crisis hotline, 911 and or go to the nearest ED for appropriate evaluation and treatment of symptoms. To follow-up with his/her primary care  provider for your other medical issues, concerns and or health care needs.   Signed: Sanjuana Kava, NP, PMHNP, FNP-BC 12/14/2016, 11:18 AM   Patient seen, Suicide Assessment Completed.  Disposition Plan Reviewed

## 2016-12-14 NOTE — Progress Notes (Addendum)
D: Patient up and visible in the milieu. Spoke with patient 1:1. Rating depression, hopelessness and anxiety all at a 0/10. Rates sleep as good, appetite as good, energy as normal and concentration as good. Patient's affect animated, mood pleasant. States goal for today is to "go home." Denies pain, physical problems.   A: Medicated per orders, no prns requested or required. Emotional support offered and self inventory reviewed. Encouraged completion of Suicide Safety Plan. Discussed POC with MD, SW.    R: Patient verbalizes understanding of POC. Patient denies SI/HI/AVH and remains safe on level III obs. Awaiting discharge.

## 2016-12-14 NOTE — Plan of Care (Signed)
Problem: [name] Participation in Recreation Therapeutic Interventions Goal: STG-Patient will identify at least five coping skills for ** STG: Coping Skills - Patient will be able to identify at least 5 coping skills for suicidal thoughts by conclusion of recreation therapy tx  Outcome: Completed/Met Date Met: 12/14/16 Pt was able to identify coping skills at completion of coping skills recreation therapy session.  [name], LRT/CTRS

## 2016-12-14 NOTE — [facility] Suicide Risk Assessment (Addendum)
[facility] Discharge Suicide Risk Assessment   Principal Problem: Bipolar 1 disorder, depressed, severe (HCC) Discharge Diagnoses:  Patient Active Problem List   Diagnosis Date Noted  . Cannabis use disorder, moderate, dependence (HCC) [F12.20] 12/11/2016  . Bipolar 1 disorder, depressed, severe (HCC) [F31.4] 12/09/2016    Total Time spent with patient: 30 minutes  Musculoskeletal: Strength & Muscle Tone: within normal limits Gait & Station: normal Patient leans: N/A  Psychiatric Specialty Exam: ROS denies headache, no chest pain, no nausea, no vomiting , no fever   Blood pressure 103/67, pulse 98, temperature 98.6 F (37 C), temperature source Oral, resp. rate 16, height  (1.727 m), weight 60.3 kg (133 lb), last menstrual period 12/06/2016, SpO2 100 %.Body mass index is 20.22 kg/m.  General Appearance: improved grooming   Eye Contact::  Fair  Speech:  Normal Rate409  Volume:  Normal  Mood:  denies sadness or depression, describes mood as " good "  Affect:  Appropriate and reactive   Thought Process:  Linear and Descriptions of Associations: Intact  Orientation:  Other:  fully alert and attentive   Thought Content:  denies hallucinations, no delusions, not internally preoccupied   Suicidal Thoughts:  No denies any suicidal or self injurious ideations   Homicidal Thoughts:  No denies any violent or homicidal ideations  Memory:  recent and remote grossly intact   Judgement:  Other:  improving   Insight:  improving   Psychomotor Activity:  Normal  Concentration:  Good  Recall:  Good  Fund of Knowledge:Good  Language: Good  Akathisia:  Negative  Handed:  Right  AIMS (if indicated):     Assets:  Communication Skills Desire for Improvement Resilience  Sleep:  Number of Hours: 6.75  Cognition: WNL  ADL's:  Intact   Mental Status Per Nursing Assessment::   On Admission:     Demographic Factors:  21 years old, single, no children, lives with mother, currently unemployed .    Loss Factors: Recent job loss, mother kicked her out of home   Historical Factors: History of depression, reports prior diagnosis of Bipolar Disorder, history of cannabis abuse , prior psychiatric admissions for " anger issues", and suicidal ideations.   Risk Reduction Factors:   Living with another person, especially a relative and Positive coping skills or problem solving skills  Continued Clinical Symptoms:  At this time patient reports improvement compared to admission . Currently alert, attentive, well related, mood improved, states she is feeling "OK", denies depression, affect appropriate, no thought disorder, no suicidal or self injurious ideations, no homicidal or violent ideations, no psychotic symptoms, future oriented. No disruptive or agitated behaviors on unit. Denies medication side effects, which we reviewed, including risk of severe rash on lamictal.    Cognitive Features That Contribute To Risk:  No gross cognitive deficits noted upon discharge. Is alert , attentive, and oriented x 3    Suicide Risk:  Mild:  Suicidal ideation of limited frequency, intensity, duration, and specificity.  There are no identifiable plans, no associated intent, mild dysphoria and related symptoms, good self-control (both objective and subjective assessment), few other risk factors, and identifiable protective factors, including available and accessible social support.  Follow-up Information    [facility] Follow up on 12/18/2016.   Why:  Monday at 2:30 with Nyra Jabs Contact information: [address] [place] [phone]          Plan Of Care/Follow-up recommendations:  Activity:  as tolerated  Diet:  Regular Tests:  NA Other:  see below  Patient is requesting discharge and there are no current grounds for involuntary commitment  Patient is leaving unit in good spirits  Plans to go live with a friend at discharge, and states she is going to start looking for a job after  discharge Follow up as above  * Patient encouraged to abstain from cannabis /illicit drugs .   Craige Cotta, MD 12/14/2016, 10:28 AM

## 2016-12-14 NOTE — Tx Team (Signed)
Interdisciplinary Treatment and Diagnostic Plan Update  12/14/2016 Time of Session: 9:30am [name] MRN: [id number]  Principal Diagnosis: Bipolar 1 disorder, depressed, severe (HCC)  Secondary Diagnoses: Principal Problem:   Bipolar 1 disorder, depressed, severe (HCC) Active Problems:   Cannabis use disorder, moderate, dependence (HCC)   Current Medications:  Current Facility-Administered Medications  Medication Dose Route Frequency Provider Last Rate Last Dose  . acetaminophen (TYLENOL) tablet 650 mg  650 mg Oral Q4H PRN Kristeen Mans, NP   650 mg at 12/13/16 0825  . alum & mag hydroxide-simeth (MAALOX/MYLANTA) 200-200-20 MG/5ML suspension 30 mL  30 mL Oral PRN Kristeen Mans, NP      . ARIPiprazole (ABILIFY) tablet 10 mg  10 mg Oral Daily Georgiann Cocker, MD   10 mg at 12/14/16 0747  . ARIPiprazole ER SRER 400 mg  400 mg Intramuscular Q30 days Jomarie Longs, MD   400 mg at 12/12/16 1103  . bisacodyl (DULCOLAX) EC tablet 5 mg  5 mg Oral Daily PRN Jomarie Longs, MD      . feeding supplement (ENSURE ENLIVE) (ENSURE ENLIVE) liquid 237 mL  237 mL Oral BID BM Rockey Situ Cobos, MD   237 mL at 12/14/16 0948  . lamoTRIgine (LAMICTAL) tablet 25 mg  25 mg Oral Daily Jomarie Longs, MD   25 mg at 12/14/16 0747  . OLANZapine (ZYPREXA) tablet 5 mg  5 mg Oral TID PRN Jomarie Longs, MD       Or  . OLANZapine (ZYPREXA) injection 5 mg  5 mg Intramuscular TID PRN Jomarie Longs, MD      . traZODone (DESYREL) tablet 50 mg  50 mg Oral QHS PRN Kristeen Mans, NP   50 mg at 12/13/16 2055    PTA Medications: Prescriptions Prior to Admission  Medication Sig Dispense Refill Last Dose  . ibuprofen (ADVIL,MOTRIN) 200 MG tablet Take 800 mg by mouth every 6 (six) hours as needed for moderate pain.   unknown    Treatment Modalities: Medication Management, Group therapy, Case management,  1 to 1 session with clinician, Psychoeducation, Recreational therapy.  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  Physician Treatment Plan for Primary Diagnosis: Bipolar 1 disorder, depressed, severe (HCC) Long Term Goal(s): Improvement in symptoms so as ready for discharge  Short Term Goals: Ability to identify changes in lifestyle to reduce recurrence of condition will improve Ability to verbalize feelings will improve Ability to disclose and discuss suicidal ideas Ability to demonstrate self-control will improve Ability to identify and develop effective coping behaviors will improve Ability to maintain clinical measurements within normal limits will improve Compliance with prescribed medications will improve Ability to identify triggers associated with substance abuse/mental health issues will improve Ability to identify changes in lifestyle to reduce recurrence of condition will improve Ability to verbalize feelings will improve Ability to disclose and discuss suicidal ideas Ability to demonstrate self-control will improve Ability to identify and develop effective coping behaviors will improve Ability to maintain clinical measurements within normal limits will improve Compliance with prescribed medications will improve Ability to identify triggers associated with substance abuse/mental health issues will improve  Medication Management: Evaluate patient's response, side effects, and tolerance of medication regimen.  Therapeutic Interventions: 1 to 1 sessions, Unit Group sessions and Medication administration.  Evaluation of Outcomes: Adequate for Discharge  Physician Treatment Plan for Secondary Diagnosis: Principal Problem:   Bipolar  1 disorder, depressed, severe (HCC) Active Problems:   Cannabis use disorder, moderate, dependence (HCC)   Long Term Goal(s): Improvement in symptoms so as ready for  discharge  Short Term Goals: Ability to identify changes in lifestyle to reduce recurrence of condition will improve Ability to verbalize feelings will improve Ability to disclose and discuss suicidal ideas Ability to demonstrate self-control will improve Ability to identify and develop effective coping behaviors will improve Ability to maintain clinical measurements within normal limits will improve Compliance with prescribed medications will improve Ability to identify triggers associated with substance abuse/mental health issues will improve Ability to identify changes in lifestyle to reduce recurrence of condition will improve Ability to verbalize feelings will improve Ability to disclose and discuss suicidal ideas Ability to demonstrate self-control will improve Ability to identify and develop effective coping behaviors will improve Ability to maintain clinical measurements within normal limits will improve Compliance with prescribed medications will improve Ability to identify triggers associated with substance abuse/mental health issues will improve  Medication Management: Evaluate patient's response, side effects, and tolerance of medication regimen.  Therapeutic Interventions: 1 to 1 sessions, Unit Group sessions and Medication administration.  Evaluation of Outcomes: Adequate for Discharge   RN Treatment Plan for Primary Diagnosis: Bipolar 1 disorder, depressed, severe (HCC) Long Term Goal(s): Knowledge of disease and therapeutic regimen to maintain health will improve  Short Term Goals: Ability to verbalize feelings will improve, Ability to disclose and discuss suicidal ideas and Ability to identify and develop effective coping behaviors will improve  Medication Management: RN will administer medications as ordered by provider, will assess and evaluate patient's response and provide education to patient for prescribed medication. RN will report any adverse and/or side  effects to prescribing provider.  Therapeutic Interventions: 1 on 1 counseling sessions, Psychoeducation, Medication administration, Evaluate responses to treatment, Monitor vital signs and CBGs as ordered, Perform/monitor CIWA, COWS, AIMS and Fall Risk screenings as ordered, Perform wound care treatments as ordered.  Evaluation of Outcomes: Adequate for Discharge   LCSW Treatment Plan for Primary Diagnosis: Bipolar 1 disorder, depressed, severe (HCC) Long Term Goal(s): Safe transition to appropriate next level of care at discharge, Engage patient in therapeutic group addressing interpersonal concerns.  Short Term Goals: Engage patient in aftercare planning with referrals and resources and Increase skills for wellness and recovery  Therapeutic Interventions: Assess for all discharge needs, 1 to 1 time with Social worker, Explore available resources and support systems, Assess for adequacy in community support network, Educate family and significant other(s) on suicide prevention, Complete Psychosocial Assessment, Interpersonal group therapy.  Evaluation of Outcomes: Adequate for Discharge   Progress in Treatment: Attending groups: Yes  Participating in groups: Yes  Taking medication as prescribed: Yes, MD continues to assess for medication changes as needed Toleration medication: Yes, no side effects reported at this time Family/Significant other contact made: Yes with mother Patient understands diagnosis: Continuing to assess Discussing patient identified problems/goals with staff: Yes Medical problems stabilized or resolved: Yes Denies suicidal/homicidal ideation: Yes Issues/concerns per patient self-inventory: None Other: N/A  New problem(s) identified: None identified at this time.   New Short Term/Long Term Goal(s): None identified at this time.   Discharge Plan or Barriers: CSW will assess for appropriate discharge plan and relevant barriers.   Reason for Continuation of  Hospitalization: Anxiety Depression Medication stabilization  Estimated Length of Stay: 2-4 days  Attendees: Patient: 12/14/2016  11:19 AM  Physician: Dr. Elna Breslow 12/14/2016  11:19 AM  Nursing: Olivia Mackie, RN 12/14/2016  11:19 AM  RN Care Manager: Onnie Boer, RN 12/14/2016  11:19 AM  Social Worker: Richelle Ito, LCSW 12/14/2016  11:19 AM  Recreational Therapist:  12/14/2016  11:19 AM  Other:  12/14/2016  11:19 AM  Other:  12/14/2016  11:19 AM  Other: 12/14/2016  11:19 AM    Scribe for Treatment Team: Verdene Lennert, LCSW 12/14/2016 11:19 AM

## 2017-01-31 DIAGNOSIS — K5900 Constipation, unspecified: Secondary | ICD-10-CM | POA: Diagnosis not present

## 2017-01-31 DIAGNOSIS — R1030 Lower abdominal pain, unspecified: Secondary | ICD-10-CM | POA: Diagnosis present

## 2017-01-31 DIAGNOSIS — Z79899 Other long term (current) drug therapy: Secondary | ICD-10-CM | POA: Insufficient documentation

## 2017-02-01 ENCOUNTER — Emergency Department (HOSPITAL_COMMUNITY): Payer: Medicaid Other

## 2017-02-01 ENCOUNTER — Encounter (HOSPITAL_COMMUNITY): Payer: Self-pay

## 2017-02-01 ENCOUNTER — Emergency Department (HOSPITAL_COMMUNITY)
Admission: EM | Admit: 2017-02-01 | Discharge: 2017-02-01 | Disposition: A | Discharge status: Home / Self Care | Payer: Medicaid Other | Attending: Emergency Medicine | Admitting: Emergency Medicine

## 2017-02-01 DIAGNOSIS — K5900 Constipation, unspecified: Secondary | ICD-10-CM

## 2017-02-01 DIAGNOSIS — R109 Unspecified abdominal pain: Secondary | ICD-10-CM

## 2017-02-01 LAB — CBC
HEMATOCRIT: 32.4 % — AB (ref 36.0–46.0)
Hemoglobin: 10.3 g/dL — ABNORMAL LOW (ref 12.0–15.0)
MCH: 22.7 pg — ABNORMAL LOW (ref 26.0–34.0)
MCHC: 31.8 g/dL (ref 30.0–36.0)
MCV: 71.4 fL — ABNORMAL LOW (ref 78.0–100.0)
Platelets: 223 10*3/uL (ref 150–400)
RBC: 4.54 MIL/uL (ref 3.87–5.11)
RDW: 14.5 % (ref 11.5–15.5)
WBC: 7.9 10*3/uL (ref 4.0–10.5)

## 2017-02-01 LAB — I-STAT CHEM 8, ED
BUN: 13 mg/dL (ref 6–20)
Calcium, Ion: 1.25 mmol/L (ref 1.15–1.40)
Chloride: 105 mmol/L (ref 101–111)
Creatinine, Ser: 0.9 mg/dL (ref 0.44–1.00)
Glucose, Bld: 94 mg/dL (ref 65–99)
HEMATOCRIT: 33 % — AB (ref 36.0–46.0)
HEMOGLOBIN: 11.2 g/dL — AB (ref 12.0–15.0)
Potassium: 3.8 mmol/L (ref 3.5–5.1)
SODIUM: 142 mmol/L (ref 135–145)
TCO2: 29 mmol/L (ref 0–100)

## 2017-02-01 LAB — PREGNANCY, URINE: Preg Test, Ur: NEGATIVE

## 2017-02-01 LAB — URINALYSIS, ROUTINE W REFLEX MICROSCOPIC
BACTERIA UA: NONE SEEN
BILIRUBIN URINE: NEGATIVE
Glucose, UA: NEGATIVE mg/dL
KETONES UR: 5 mg/dL — AB
Nitrite: NEGATIVE
Protein, ur: NEGATIVE mg/dL
Specific Gravity, Urine: 1.03 (ref 1.005–1.030)
pH: 5 (ref 5.0–8.0)

## 2017-02-01 MED ORDER — IBUPROFEN 600 MG PO TABS: 600.0000 mg | ORAL_TABLET | Freq: Four times a day (QID) | ORAL | 0 refills | Status: DC | PRN

## 2017-02-01 MED ORDER — POLYETHYLENE GLYCOL 3350 17 GM/SCOOP PO POWD: 17.0000 g | Freq: Every day | ORAL | 0 refills | Status: DC

## 2017-02-01 MED ORDER — NAPROXEN 500 MG PO TABS
500.0000 mg | ORAL_TABLET | Freq: Once | ORAL | Status: AC
  Administered 2017-02-01: 500 mg via ORAL
  Filled 2017-02-01: qty 1

## 2017-02-01 NOTE — ED Provider Notes (Signed)
[facility]-EMERGENCY DEPT Provider Note   CSN: [id number] Arrival date & time: 01/31/17  2347     History   Chief Complaint Chief Complaint  Patient presents with  . Abdominal Pain    HPI [name] is a 21 y.o. female.  21 year old female with a history of bipolar 1 disorder presents to the emergency department for evaluation of abdominal pain. She reports awaking from sleep yesterday with lower abdominal pain. She had the urge to defecate, but has been unable to have bowel movement over the past 48 hours. She has been passing flatus regularly. She states that her pain is sharp and intermittent, present in her lower abdomen. It is improved when lying supine. She has had no associated fevers. No nausea, vomiting, urinary symptoms, or vaginal complaints. No history of abdominal surgeries. Patient expresses concern for possible pregnancy, but states that her last menstrual period was 1 week ago.   The history is provided by the patient. No language interpreter was used.  Abdominal Pain      Past Medical History:  Diagnosis Date  . Bipolar 1 disorder [facility](HCC)     Patient Active Problem List   Diagnosis Date Noted  . Cannabis use disorder, moderate, dependence (HCC) 12/11/2016  . Bipolar 1 disorder, depressed, severe (HCC) 12/09/2016    History reviewed. No pertinent surgical history.  OB History    No data available       Home Medications    Prior to Admission medications   Medication Sig Start Date End Date Taking? Authorizing Provider  ARIPiprazole (ABILIFY) 10 MG tablet Take 1 tablet (10 mg total) by mouth daily. For mood control Patient not taking: Reported on 02/01/2017 12/15/16   Armandina StammerNwoko, Agnes I, NP  ARIPiprazole ER 400 MG SRER Inject 400 mg into the muscle every 30 (thirty) days. (Due on 01-11-17): For mood control Patient not taking: Reported on 02/01/2017 01/11/17   Armandina StammerNwoko, Agnes I, NP  bisacodyl (DULCOLAX) 5 MG EC tablet Take 1 tablet (5 mg total) by mouth daily as  needed for moderate constipation. (May purchase from OTC)): For constipation Patient not taking: Reported on 02/01/2017 12/14/16   Armandina StammerNwoko, Agnes I, NP  ibuprofen (ADVIL,MOTRIN) 600 MG tablet Take 1 tablet (600 mg total) by mouth every 6 (six) hours as needed. 02/01/17   Antony MaduraHumes, Angelyse Heslin, PA-C  lamoTRIgine (LAMICTAL) 25 MG tablet Take 1 tablet (25 mg total) by mouth daily. For mood stabilization Patient not taking: Reported on 02/01/2017 12/15/16   Armandina StammerNwoko, Agnes I, NP  polyethylene glycol powder (GLYCOLAX/MIRALAX) powder Take 17 g by mouth daily. Until daily soft stools  OTC 02/01/17   Antony MaduraHumes, Deloyce Walthers, PA-C  traZODone (DESYREL) 50 MG tablet Take 1 tablet (50 mg total) by mouth at bedtime as needed for sleep. Patient not taking: Reported on 02/01/2017 12/14/16   Sanjuana KavaNwoko, Agnes I, NP    Family History Family History  Problem Relation Age of Onset  . Drug abuse Mother     Social History Social History  Substance Use Topics  . Smoking status: Never Smoker  . Smokeless tobacco: Never Used  . Alcohol use No     Allergies   Patient has no known allergies.   Review of Systems Review of Systems  Gastrointestinal: Positive for abdominal pain.  Ten systems reviewed and are negative for acute change, except as noted in the HPI.    Physical Exam Updated Vital Signs BP 110/80 (BP Location: Left Arm)   Pulse 74   Temp 97.9 F (  36.6 C) (Oral)   Resp 18   Ht 5\' 5"  (1.651 m)   Wt 60.3 kg (133 lb)   LMP 01/24/2017 Comment: neg preg test  SpO2 100%   BMI 22.13 kg/m   Physical Exam  Constitutional: She is oriented to person, place, and time. She appears well-developed and well-nourished. No distress.  Nontoxic appearing and in no acute distress  HENT:  Head: Normocephalic and atraumatic.  Eyes: Conjunctivae and EOM are normal. No scleral icterus.  Neck: Normal range of motion.  Cardiovascular: Normal rate, regular rhythm and intact distal pulses.   Pulmonary/Chest: Effort normal. No respiratory  distress. She has no wheezes. She has no rales.  Respirations even and unlabored.  Abdominal:  Normoactive bowel sounds. Abdomen soft, nondistended. No focal tenderness appreciated. No masses or peritoneal signs.  Musculoskeletal: Normal range of motion.  Neurological: She is alert and oriented to person, place, and time.  Skin: Skin is warm and dry. No rash noted. She is not diaphoretic. No erythema. No pallor.  Psychiatric: She has a normal mood and affect. Her behavior is normal.  Nursing note and vitals reviewed.    ED Treatments / Results  Labs (all labs ordered are listed, but only abnormal results are displayed) Labs Reviewed  CBC - Abnormal; Notable for the following:       Result Value   Hemoglobin 10.3 (*)    HCT 32.4 (*)    MCV 71.4 (*)    MCH 22.7 (*)    All other components within normal limits  URINALYSIS, ROUTINE W REFLEX MICROSCOPIC - Abnormal; Notable for the following:    Hgb urine dipstick SMALL (*)    Ketones, ur 5 (*)    Leukocytes, UA TRACE (*)    Squamous Epithelial / LPF 0-5 (*)    All other components within normal limits  I-STAT CHEM 8, ED - Abnormal; Notable for the following:    Hemoglobin 11.2 (*)    HCT 33.0 (*)    All other components within normal limits  PREGNANCY, URINE    EKG  EKG Interpretation None       Radiology Dg Abd 2 Views  Result Date: 02/01/2017 CLINICAL DATA:  Mid abdominal pain for 1 week. EXAM: ABDOMEN - 2 VIEW COMPARISON:  None. FINDINGS: No bowel dilatation to suggest obstruction. Moderate stool in the proximal colon. Air-filled normal caliber distal colon. No free air. No radiopaque calculi or abnormal soft tissue calcifications. Lower most lung bases are clear. No osseous abnormality. IMPRESSION: Negative abdominal radiographs.  Normal bowel gas pattern. Electronically Signed   By: Rubye Oaks M.D.   On: 02/01/2017 03:26    Procedures Procedures (including critical care time)  Medications Ordered in  ED Medications  naproxen (NAPROSYN) tablet 500 mg (500 mg Oral Given 02/01/17 0318)     Initial Impression / Assessment and Plan / ED Course  I have reviewed the triage vital signs and the nursing notes.  Pertinent labs & imaging results that were available during my care of the patient were reviewed by me and considered in my medical decision making (see chart for details).     21 year old female presents to the emergency department for evaluation of abdominal pain which began yesterday morning. It has been waxing and waning in severity. Symptoms likely secondary to constipation as patient has not had a bowel movement in the past 2 days. She has been passing flatus regularly. X-ray does show moderate stool in the proximal colon. No evidence of obstruction  or free air.  Laboratory workup reviewed. This is reassuring. Patient is afebrile and with stable vital signs. No focal abdominal TTP on initial or repeat exam. I have discussed supportive management with outpatient MiraLAX. Patient has been advised to follow-up with a primary care doctor, especially if symptoms persist. Return precautions provided. Patient discharged in stable condition.   Final Clinical Impressions(s) / ED Diagnoses   Final diagnoses:  Abdominal pain  Constipation, unspecified constipation type    New Prescriptions New Prescriptions   IBUPROFEN (ADVIL,MOTRIN) 600 MG TABLET    Take 1 tablet (600 mg total) by mouth every 6 (six) hours as needed.   POLYETHYLENE GLYCOL POWDER (GLYCOLAX/MIRALAX) POWDER    Take 17 g by mouth daily. Until daily soft stools  OTC     Antony Madura, PA-C 02/01/17 0501    Ward, Layla Maw, DO 02/01/17 [phone]

## 2017-02-01 NOTE — ED Notes (Signed)
Pt is alert and orinted x 4 and is verbally responsive. Pt co/ 7/10 lower abdominal pain bilateral. Pt denies N/V/D. Pt reports last BM was on 01/29/17. Pt eating sandwich.

## 2017-02-01 NOTE — ED Triage Notes (Signed)
Pt complains of abd pain since this am, no vomiting or diarrhea

## 2017-02-09 ENCOUNTER — Encounter (HOSPITAL_COMMUNITY): Payer: Self-pay | Admitting: *Deleted

## 2017-02-09 ENCOUNTER — Emergency Department (HOSPITAL_COMMUNITY)
Admission: EM | Admit: 2017-02-09 | Discharge: 2017-02-10 | Disposition: A | Discharge status: Home / Self Care | Payer: No Typology Code available for payment source | Attending: Emergency Medicine | Admitting: Emergency Medicine

## 2017-02-09 DIAGNOSIS — F3131 Bipolar disorder, current episode depressed, mild: Secondary | ICD-10-CM | POA: Diagnosis present

## 2017-02-09 DIAGNOSIS — Z79899 Other long term (current) drug therapy: Secondary | ICD-10-CM | POA: Diagnosis not present

## 2017-02-09 DIAGNOSIS — F1290 Cannabis use, unspecified, uncomplicated: Secondary | ICD-10-CM | POA: Diagnosis not present

## 2017-02-09 DIAGNOSIS — F17200 Nicotine dependence, unspecified, uncomplicated: Secondary | ICD-10-CM | POA: Diagnosis not present

## 2017-02-09 DIAGNOSIS — G479 Sleep disorder, unspecified: Secondary | ICD-10-CM | POA: Diagnosis not present

## 2017-02-09 DIAGNOSIS — F1220 Cannabis dependence, uncomplicated: Secondary | ICD-10-CM | POA: Diagnosis present

## 2017-02-09 DIAGNOSIS — F419 Anxiety disorder, unspecified: Secondary | ICD-10-CM

## 2017-02-09 DIAGNOSIS — F418 Other specified anxiety disorders: Secondary | ICD-10-CM | POA: Diagnosis present

## 2017-02-09 LAB — COMPREHENSIVE METABOLIC PANEL
ALBUMIN: 3.5 g/dL (ref 3.5–5.0)
ALK PHOS: 47 U/L (ref 38–126)
ALT: 13 U/L — ABNORMAL LOW (ref 14–54)
ANION GAP: 6 (ref 5–15)
AST: 14 U/L — AB (ref 15–41)
BUN: 9 mg/dL (ref 6–20)
CALCIUM: 8.9 mg/dL (ref 8.9–10.3)
CO2: 29 mmol/L (ref 22–32)
Chloride: 105 mmol/L (ref 101–111)
Creatinine, Ser: 0.69 mg/dL (ref 0.44–1.00)
GFR calc Af Amer: >60 mL/min (ref >60)
GFR calc non Af Amer: >60 mL/min (ref >60)
GLUCOSE: 102 mg/dL — AB (ref 65–99)
Potassium: 4 mmol/L (ref 3.5–5.1)
SODIUM: 140 mmol/L (ref 135–145)
Total Bilirubin: 0.2 mg/dL — ABNORMAL LOW (ref 0.3–1.2)
Total Protein: 6.5 g/dL (ref 6.5–8.1)

## 2017-02-09 LAB — RAPID URINE DRUG SCREEN, HOSP PERFORMED
Amphetamines: NOT DETECTED
BARBITURATES: NOT DETECTED
BENZODIAZEPINES: NOT DETECTED
COCAINE: NOT DETECTED
Opiates: NOT DETECTED
TETRAHYDROCANNABINOL: POSITIVE — AB

## 2017-02-09 LAB — CBC WITH DIFFERENTIAL/PLATELET
BAND NEUTROPHILS: 0 %
BLASTS: 0 %
Basophils Absolute: 0 10*3/uL (ref 0.0–0.1)
Basophils Relative: 0 %
EOS ABS: 0 10*3/uL (ref 0.0–0.7)
Eosinophils Relative: 0 %
HEMATOCRIT: 33.4 % — AB (ref 36.0–46.0)
HEMOGLOBIN: 10.6 g/dL — AB (ref 12.0–15.0)
LYMPHS ABS: 2.6 10*3/uL (ref 0.7–4.0)
Lymphocytes Relative: 39 %
MCH: 22.4 pg — ABNORMAL LOW (ref 26.0–34.0)
MCHC: 31.7 g/dL (ref 30.0–36.0)
MCV: 70.5 fL — ABNORMAL LOW (ref 78.0–100.0)
METAMYELOCYTES PCT: 0 %
MONO ABS: 0.2 10*3/uL (ref 0.1–1.0)
MYELOCYTES: 0 %
Monocytes Relative: 3 %
Neutro Abs: 3.4 10*3/uL (ref 1.7–7.7)
Neutrophils Relative %: 50 %
Other: 8 %
PROMYELOCYTES ABS: 0 %
Platelets: 346 10*3/uL (ref 150–400)
RBC: 4.74 MIL/uL (ref 3.87–5.11)
RDW: 14.6 % (ref 11.5–15.5)
WBC: 6.7 10*3/uL (ref 4.0–10.5)
nRBC: 0 /100 WBC

## 2017-02-09 LAB — ETHANOL: Alcohol, Ethyl (B): <5 mg/dL (ref <5)

## 2017-02-09 LAB — PREGNANCY, URINE: PREG TEST UR: NEGATIVE

## 2017-02-09 MED ORDER — TRAZODONE HCL 50 MG PO TABS: 50.0000 mg | ORAL_TABLET | Freq: Every evening | ORAL | Status: DC | PRN

## 2017-02-09 MED ORDER — IBUPROFEN 200 MG PO TABS: 600.0000 mg | ORAL_TABLET | Freq: Four times a day (QID) | ORAL | Status: DC | PRN

## 2017-02-09 MED ORDER — LAMOTRIGINE 25 MG PO TABS
25.0000 mg | ORAL_TABLET | Freq: Every day | ORAL | Status: DC
  Administered 2017-02-09 – 2017-02-10 (×2): 25 mg via ORAL
  Filled 2017-02-09 (×2): qty 1

## 2017-02-09 MED ORDER — ARIPIPRAZOLE 10 MG PO TABS
10.0000 mg | ORAL_TABLET | Freq: Every day | ORAL | Status: DC
  Administered 2017-02-09 – 2017-02-10 (×2): 10 mg via ORAL
  Filled 2017-02-09 (×2): qty 1

## 2017-02-09 NOTE — ED Provider Notes (Addendum)
[facility]-EMERGENCY DEPT Provider Note   CSN: [id number] Arrival date & time: 02/09/17  9528     History   Chief Complaint Chief Complaint  Patient presents with  . Depression    HPI [name] is a 21 y.o. female. Chief complaint is depression and anxiety.  HPI: 21 year old female with history of bipolar disorder. She states she is here because she has worsening depression and anxiety and wants to be evaluated "before it gets to the point that I'm suicidal". She has been admitted to behavioral health before for depression and anxiety and bipolar disorder. She is currently off her medications. She states that she has been for 6 months. States that she went off of them with plans to joining the army "never went and signed out".  She states that she at times will talk to herself. She denies any hallucinations. She smokes marijuana daily. Denies any alcohol use.  Past Medical History:  Diagnosis Date  . Bipolar 1 disorder [facility])     Patient Active Problem List   Diagnosis Date Noted  . Cannabis use disorder, moderate, dependence (HCC) 12/11/2016  . Bipolar 1 disorder, depressed, severe (HCC) 12/09/2016    History reviewed. No pertinent surgical history.  OB History    No data available       Home Medications    Prior to Admission medications   Medication Sig Start Date End Date Taking? Authorizing Provider  ARIPiprazole (ABILIFY) 10 MG tablet Take 1 tablet (10 mg total) by mouth daily. For mood control Patient not taking: Reported on 02/01/2017 12/15/16   Armandina Stammer I, NP  ARIPiprazole ER 400 MG SRER Inject 400 mg into the muscle every 30 (thirty) days. (Due on 01-11-17): For mood control Patient not taking: Reported on 02/01/2017 01/11/17   Armandina Stammer I, NP  bisacodyl (DULCOLAX) 5 MG EC tablet Take 1 tablet (5 mg total) by mouth daily as needed for moderate constipation. (May purchase from OTC)): For constipation Patient not taking: Reported on 02/01/2017 12/14/16   Armandina Stammer I, NP  ibuprofen (ADVIL,MOTRIN) 600 MG tablet Take 1 tablet (600 mg total) by mouth every 6 (six) hours as needed. 02/01/17   Antony Madura, PA-C  lamoTRIgine (LAMICTAL) 25 MG tablet Take 1 tablet (25 mg total) by mouth daily. For mood stabilization Patient not taking: Reported on 02/01/2017 12/15/16   Armandina Stammer I, NP  polyethylene glycol powder (GLYCOLAX/MIRALAX) powder Take 17 g by mouth daily. Until daily soft stools  OTC 02/01/17   Antony Madura, PA-C  traZODone (DESYREL) 50 MG tablet Take 1 tablet (50 mg total) by mouth at bedtime as needed for sleep. Patient not taking: Reported on 02/01/2017 12/14/16   Sanjuana Kava, NP    Family History Family History  Problem Relation Age of Onset  . Drug abuse Mother     Social History Social History  Substance Use Topics  . Smoking status: Current Every Day Smoker  . Smokeless tobacco: Never Used  . Alcohol use No     Allergies   Patient has no known allergies.   Review of Systems Review of Systems  Constitutional: Negative for appetite change, chills, diaphoresis, fatigue and fever.  HENT: Negative for mouth sores, sore throat and trouble swallowing.   Eyes: Negative for visual disturbance.  Respiratory: Negative for cough, chest tightness, shortness of breath and wheezing.   Cardiovascular: Negative for chest pain.  Gastrointestinal: Negative for abdominal distention, abdominal pain, diarrhea, nausea and vomiting.  Endocrine: Negative for polydipsia, polyphagia  and polyuria.  Genitourinary: Negative for dysuria, frequency and hematuria.  Musculoskeletal: Negative for gait problem.  Skin: Negative for color change, pallor and rash.  Neurological: Negative for dizziness, syncope, light-headedness and headaches.  Hematological: Does not bruise/bleed easily.  Psychiatric/Behavioral: Positive for dysphoric mood. Negative for behavioral problems, confusion and suicidal ideas. The patient is nervous/anxious.      Physical  Exam Updated Vital Signs BP 114/65 (BP Location: Left Arm)   Pulse 74   Temp 97.6 F (36.4 C) (Oral)   Resp 18   Ht 5\' 5"  (1.651 m)   Wt 59 kg (130 lb)   LMP 01/24/2017 Comment: neg preg test  SpO2 100%   BMI 21.63 kg/m   Physical Exam  Constitutional: She is oriented to person, place, and time. She appears well-developed and well-nourished. No distress.  HENT:  Head: Normocephalic.  Eyes: Conjunctivae are normal. Pupils are equal, round, and reactive to light. No scleral icterus.  Neck: Normal range of motion. Neck supple. No thyromegaly present.  Cardiovascular: Normal rate and regular rhythm.  Exam reveals no gallop and no friction rub.   No murmur heard. Pulmonary/Chest: Effort normal and breath sounds normal. No respiratory distress. She has no wheezes. She has no rales.  Abdominal: Soft. Bowel sounds are normal. She exhibits no distension. There is no tenderness. There is no rebound.  Musculoskeletal: Normal range of motion.  Neurological: She is alert and oriented to person, place, and time.  Skin: Skin is warm and dry. No rash noted.  Psychiatric: Thought content normal. Her mood appears anxious. Her speech is rapid and/or pressured. She is hyperactive. Cognition and memory are normal.     ED Treatments / Results  Labs (all labs ordered are listed, but only abnormal results are displayed) Labs Reviewed  CBC WITH DIFFERENTIAL/PLATELET  COMPREHENSIVE METABOLIC PANEL  ETHANOL  RAPID URINE DRUG SCREEN, HOSP PERFORMED  POC URINE PREG, ED    EKG  EKG Interpretation None       Radiology No results found.  Procedures Procedures (including critical care time)  Medications Ordered in ED Medications - No data to display   Initial Impression / Assessment and Plan / ED Course  I have reviewed the triage vital signs and the nursing notes.  Pertinent labs & imaging results that were available during my care of the patient were reviewed by me and considered in my  medical decision making (see chart for details).     Plan lab with [facility] and screening labs. TTS eval.  Final Clinical Impressions(s) / ED Diagnoses   Final diagnoses:  Depression, unspecified depression type  Anxiety   Reassuring labs. Positive for THC only. Seen and evaluated by behavioral health staff. Placed placed back on medications. Will be reevaluated in a.m.   New Prescriptions New Prescriptions   No medications on file     Rolland PorterJames, Athleen Feltner, MD 02/09/17 [id number]    Rolland PorterJames, Rayner Erman, MD 02/09/17 1253

## 2017-02-09 NOTE — ED Notes (Signed)
Pt states she stopped taking her psych meds 1 year ago. Has been feel "down" lately. Denies SI today but has had recent suicidal thoughts. States she does not have a good support system at home and has been doubting herself a lot lately. She advises she wants to get help before she becomes suicidal.

## 2017-02-09 NOTE — ED Notes (Signed)
Pt oriented to room and unit.  Pt is pleasant and cooperative.  She contracts for safety.  Pt denies H/I and AVH.  15 minute checks and video monitoring in place.

## 2017-02-09 NOTE — BH Assessment (Signed)
[facility] Assessment Progress Note   Case was staffed with Lord DNP who recommended patient be re-evaluated in the a.m.    

## 2017-02-09 NOTE — ED Notes (Signed)
SBAR Report received from previous nurse. Pt received calm and visible on unit. Pt denies current SI/ HI, A/V H, depression, anxiety, or pain at this time, and appears otherwise stable and free of distress. Pt reminded of camera surveillance, q 15 min rounds, and rules of the milieu. Will continue to assess. 

## 2017-02-09 NOTE — BH Assessment (Addendum)
Assessment Note  [name] is an 21 y.o. female that presents this date with thoughts of self harm with a plan to hang herself. Patient is oriented to time/place and denies H/I or AVH. Patient states [facility] for S/I but did not follow up with OP aftercare. Patient reports she was diagnosed with a Bipolar D/O "years ago" while residing in [place] but has not been on medication/s (Abilify) since August 2017. Patient is a poor historian and cannot recall the name of the provider in [place] that assisted her with medication management. Patient states she received [facility] but denies any other OP treatment or inpatient admissions prior to 12/09/16.   Patient states she was transported this date [facility] by her cousin who she resides with, after having thoughts of self harm. Patient reports her depression associated with Her Bipolar D/O "has really been bad" over the last week with symptoms to include: feeling hopeless and isolating. Patient states she uses Cannabis daily (1 gram or more) for the last year with last use earlier this date reporting smoking "half a blunt." patient denies any other SA use. Patient is requesting a voluntary admission to assist with medication management and stabilization. Per notes, patient has a history of bipolar 1 disorder, who presents to the Emergency Department presenting with gradually worsening, constant, suicidal ideations that occurred within the last week.  Per patient she notes having a job at [employer], which she recently loss. She reports feeling stressed and "being heavy on the weed" for the past three weeks. She states two days ago, patient was asked for rent money by her mother and was kicked out of her home due to non-payment. Per nurse note, patient thought about jumping in front of a car but denies following through with the plan. Patient reports having a h/o of suicidal ideations but denies having a  specific plan in place.  Patient is here because she has worsening depression and anxiety. Case was staffed with Shaune PollackLord DNP who recommended patient be re-evaluated in the a.m.   Diagnosis: Bipolar 1 most recent episode depressed, Cannabis abuse severe  Past Medical History:  Past Medical History:  Diagnosis Date  . Bipolar 1 disorder (HCC)     History reviewed. No pertinent surgical history.  Family History:  Family History  Problem Relation Age of Onset  . Drug abuse Mother     Social History:  reports that she has been smoking.  She has never used smokeless tobacco. She reports that she uses drugs, including Marijuana. She reports that she does not drink alcohol.  Additional Social History:  Alcohol / Drug Use Pain Medications: None Prescriptions: None Over the Counter: None History of alcohol / drug use?: Yes Longest period of sobriety (when/how long): Unknown Negative Consequences of Use:  (Denies) Withdrawal Symptoms:  (Denies) Substance #1 Name of Substance 1: Cannabis 1 - Age of First Use: 18 1 - Amount (size/oz): 1 gram 1 - Frequency: Daily 1 - Duration: Last year 1 - Last Use / Amount: 02/08/17  CIWA: CIWA-Ar BP: 114/65 Pulse Rate: 74 COWS:    Allergies: No Known Allergies  Home Medications:  (Not in a hospital admission)  OB/GYN Status:  Patient's last menstrual period was 01/24/2017.  General Assessment Data [facility] of Assessment: [facility] TTS Assessment: In system Is this a Tele or Face-to-Face Assessment?: Face-to-Face Is this an Initial Assessment or a Re-assessment for this encounter?: Initial Assessment  Marital status: Single Maiden name: NA Is patient pregnant?: No Pregnancy Status: No Living Arrangements: Other relatives Can pt return to current living arrangement?: Yes Admission Status: Voluntary Is patient capable of signing voluntary admission?: Yes Referral Source: Self/Family/Friend Insurance type: Medicaid  Medical Screening Exam  [facility] Walk-in ONLY) Medical Exam completed: Yes  Crisis Care Plan Living Arrangements: Other relatives Legal Guardian:  (NA) Name of Psychiatrist: None Name of Therapist: None  Education Status Is patient currently in school?: No Current Grade:  (NA) Highest grade of school patient has completed: 12th Name of school: NA Contact person: NA  Risk to self with the past 6 months Suicidal Ideation: Yes-Currently Present Has patient been a risk to self within the past 6 months prior to admission? : Yes Suicidal Intent: Yes-Currently Present Has patient had any suicidal intent within the past 6 months prior to admission? : Yes Is patient at risk for suicide?: Yes Suicidal Plan?: Yes-Currently Present Has patient had any suicidal plan within the past 6 months prior to admission? : Yes Specify Current Suicidal Plan: hanging  Access to Means: Yes Specify Access to Suicidal Means: pt states they have a rope What has been your use of drugs/alcohol within the last 12 months?: current use Previous Attempts/Gestures: Yes How many times?: 2 Other Self Harm Risks: NA Triggers for Past Attempts: Unknown Intentional Self Injurious Behavior: None Family Suicide History: No Recent stressful life event(s): Other (Comment) (Family issues) Persecutory voices/beliefs?: No Depression: Yes Depression Symptoms: Feeling worthless/self pity Substance abuse history and/or treatment for substance abuse?: Yes Suicide prevention information given to non-admitted patients: Not applicable  Risk to Others within the past 6 months Homicidal Ideation: No Does patient have any lifetime risk of violence toward others beyond the six months prior to admission? : No Thoughts of Harm to Others: No Current Homicidal Intent: No Current Homicidal Plan: No Access to Homicidal Means: No Identified Victim: NA History of harm to others?: No Assessment of Violence: None Noted Violent Behavior Description: NA Does  patient have access to weapons?: No Criminal Charges Pending?: No Does patient have a court date: No Is patient on probation?: No  Psychosis Hallucinations: None noted Delusions: None noted  Mental Status Report Appearance/Hygiene: Unremarkable Eye Contact: Good Motor Activity: Freedom of movement Speech: Logical/coherent Level of Consciousness: Alert Mood: Depressed Affect: Appropriate to circumstance Anxiety Level: Minimal Thought Processes: Coherent, Relevant Judgement: Unimpaired Orientation: Person, Place, Time Obsessive Compulsive Thoughts/Behaviors: None  Cognitive Functioning Concentration: Normal Memory: Recent Intact, Remote Intact IQ: Average Insight: Fair Impulse Control: Fair Appetite: Poor Weight Loss: 10 Weight Gain: 0 Sleep: Decreased Total Hours of Sleep: 5 Vegetative Symptoms: None  ADLScreening [facility] Assessment Services) Patient's cognitive ability adequate to safely complete daily activities?: Yes Patient able to express need for assistance with ADLs?: Yes Independently performs ADLs?: Yes (appropriate for developmental age)  Prior Inpatient Therapy Prior Inpatient Therapy: Yes Prior Therapy Dates: 2018 Prior Therapy Facilty/Provider(s): [facility] Reason for Treatment: MH issues  Prior Outpatient Therapy Prior Outpatient Therapy: Yes Prior Therapy Dates: 2017 Prior Therapy Facilty/Provider(s): [facility]  Reason for Treatment: MH issues Does patient have an ACCT team?: No Does patient have Intensive In-House Services?  : No Does patient have [facility] services? : No Does patient have P4CC services?: No  ADL Screening (condition at time of admission) Patient's cognitive ability adequate to safely complete daily activities?: Yes Is the patient deaf or have difficulty hearing?: No Does the patient have difficulty seeing, even when wearing glasses/contacts?: No Does the  patient have difficulty concentrating, remembering, or making decisions?:  No Patient able to express need for assistance with ADLs?: Yes Does the patient have difficulty dressing or bathing?: No Independently performs ADLs?: Yes (appropriate for developmental age) Does the patient have difficulty walking or climbing stairs?: No Weakness of Legs: None Weakness of Arms/Hands: None  Home Assistive Devices/Equipment Home Assistive Devices/Equipment: None  Therapy Consults (therapy consults require a physician order) PT Evaluation Needed: No OT Evalulation Needed: No SLP Evaluation Needed: No Abuse/Neglect Assessment (Assessment to be complete while patient is alone) Physical Abuse: Denies Verbal Abuse: Denies Sexual Abuse: Denies Exploitation of patient/patient's resources: Denies Self-Neglect: Denies Values / Beliefs Cultural Requests During Hospitalization: None Spiritual Requests During Hospitalization: None Consults Spiritual Care Consult Needed: No Social Work Consult Needed: No Merchant navy officer (For Healthcare) Does Patient Have a Medical Advance Directive?: No Would patient like information on creating a medical advance directive?: No - Patient declined    Additional Information 1:1 In Past 12 Months?: No CIRT Risk: No Elopement Risk: No Does patient have medical clearance?: Yes     Disposition: Case was staffed with Shaune Pollack DNP who recommended patient be re-evaluated in the a.m.     On Site Evaluation by:   Reviewed with Physician:    Alfredia Ferguson 02/09/2017 8:45 AM

## 2017-02-09 NOTE — ED Triage Notes (Signed)
Patient is alert and oriented x4.  She is seeking help with her depression before it going to the suicidal level.  Patient adds that both her feet hurt from neuropathy.

## 2017-02-10 DIAGNOSIS — G479 Sleep disorder, unspecified: Secondary | ICD-10-CM

## 2017-02-10 DIAGNOSIS — Z79899 Other long term (current) drug therapy: Secondary | ICD-10-CM

## 2017-02-10 DIAGNOSIS — F17200 Nicotine dependence, unspecified, uncomplicated: Secondary | ICD-10-CM

## 2017-02-10 DIAGNOSIS — F3131 Bipolar disorder, current episode depressed, mild: Secondary | ICD-10-CM

## 2017-02-10 DIAGNOSIS — F1290 Cannabis use, unspecified, uncomplicated: Secondary | ICD-10-CM

## 2017-02-10 DIAGNOSIS — Z791 Long term (current) use of non-steroidal anti-inflammatories (NSAID): Secondary | ICD-10-CM

## 2017-02-10 MED ORDER — LAMOTRIGINE 25 MG PO TABS: 25.0000 mg | ORAL_TABLET | Freq: Every day | ORAL | 0 refills | Status: DC

## 2017-02-10 MED ORDER — TRAZODONE HCL 50 MG PO TABS: 50.0000 mg | ORAL_TABLET | Freq: Every evening | ORAL | 0 refills | Status: DC | PRN

## 2017-02-10 MED ORDER — ARIPIPRAZOLE 10 MG PO TABS: 10.0000 mg | ORAL_TABLET | Freq: Every day | ORAL | 0 refills | Status: DC

## 2017-02-10 NOTE — Discharge Instructions (Signed)
Patient will follow up with [facility] at [address] [place] [phone].

## 2017-02-10 NOTE — Consult Note (Signed)
[name] Psychiatry Consult   Reason for Consult:  Depression  Referring Physician:  EDP Patient Identification: [name] MRN:  [id number] Principal Diagnosis: Bipolar affective disorder, depressed, mild ([place]) Diagnosis:   Patient Active Problem List   Diagnosis Date Noted  . Bipolar affective disorder, depressed, mild ([place]) [F31.31] 02/09/2017    Priority: High  . Cannabis use disorder, moderate, dependence ([place]) [F12.20] 12/11/2016    Priority: High    Total Time spent with patient: 45 minutes  Subjective:   [name] is a 21 y.o. female patient does not warrant admission.  HPI:  21 yo female who came to the ED with depression and did not want to escalate.  She was recently at [name] but did not follow-up with her outpatient provider or take her medications.  [name] had an argument with the family she was living with and told to leave.  This has occurred many times but she does say she can live with her cousin.   Her medications were restarted and she stabilized.  No suicidal/homicidal ideations,hallucinations, or withdrawal symptoms.  Cannabis use on a regular basis.  Past Psychiatric History: bipolar, cannabis abuse  Risk to Self: None Risk to Others: Homicidal Ideation: No Thoughts of Harm to Others: No Current Homicidal Intent: No Current Homicidal Plan: No Access to Homicidal Means: No Identified Victim: NA History of harm to others?: No Assessment of Violence: None Noted Violent Behavior Description: NA Does patient have access to weapons?: No Criminal Charges Pending?: No Does patient have a court date: No Prior Inpatient Therapy: Prior Inpatient Therapy: Yes Prior Therapy Dates: 2018 Prior Therapy Facilty/Provider(s): [name] Reason for Treatment: MH issues Prior Outpatient Therapy: Prior Outpatient Therapy: Yes Prior Therapy Dates: 2017 Prior Therapy Facilty/Provider(s): [name]  Reason for Treatment: MH issues Does patient have an ACCT  team?: No Does patient have Intensive In-House Services?  : No Does patient have [facility] services? : No Does patient have P4CC services?: No  Past Medical History:  Past Medical History:  Diagnosis Date  . Bipolar 1 disorder ([place])    History reviewed. No pertinent surgical history. Family History:  Family History  Problem Relation Age of Onset  . Drug abuse Mother    Family Psychiatric  History: unknown Social History:  History  Alcohol Use No     History  Drug Use  . Types: Marijuana    Social History   Social History  . Marital status: Single    Spouse name: N/A  . Number of children: N/A  . Years of education: N/A   Social History Main Topics  . Smoking status: Current Every Day Smoker  . Smokeless tobacco: Never Used  . Alcohol use No  . Drug use: Yes    Types: Marijuana  . Sexual activity: Not Asked   Other Topics Concern  . None   Social History Narrative  . None   Additional Social History:    Allergies:  No Known Allergies  Labs:  Results for orders placed or performed during the hospital encounter of 02/09/17 (from the past 48 hour(s))  CBC with Differential/Platelet     Status: Abnormal   Collection Time: 02/09/17  7:28 AM  Result Value Ref Range   WBC 6.7 4.0 - 10.5 K/uL   RBC 4.74 3.87 - 5.11 MIL/uL   Hemoglobin 10.6 (L) 12.0 - 15.0 g/dL   HCT 33.4 (L) 36.0 - 46.0 %   MCV 70.5 (L) 78.0 - 100.0 fL   MCH 22.4 (L) 26.0 -  34.0 pg   MCHC 31.7 30.0 - 36.0 g/dL   RDW 14.6 11.5 - 15.5 %   Platelets 346 150 - 400 K/uL    Comment: PLATELET COUNT CONFIRMED BY SMEAR   Neutrophils Relative % 50 %   Lymphocytes Relative 39 %   Monocytes Relative 3 %   Eosinophils Relative 0 %   Basophils Relative 0 %   Band Neutrophils 0 %   Metamyelocytes Relative 0 %   Myelocytes 0 %   Promyelocytes Absolute 0 %   Blasts 0 %   nRBC 0 0 /100 WBC   Other 8 %   RBC Morphology CRENATED RBCs    WBC Morphology ATYPICAL LYMPHOCYTES    Neutro Abs 3.4 1.7 - 7.7  K/uL   Lymphs Abs 2.6 0.7 - 4.0 K/uL   Monocytes Absolute 0.2 0.1 - 1.0 K/uL   Eosinophils Absolute 0.0 0.0 - 0.7 K/uL   Basophils Absolute 0.0 0.0 - 0.1 K/uL  Comprehensive metabolic panel     Status: Abnormal   Collection Time: 02/09/17  7:28 AM  Result Value Ref Range   Sodium 140 135 - 145 mmol/L   Potassium 4.0 3.5 - 5.1 mmol/L   Chloride 105 101 - 111 mmol/L   CO2 29 22 - 32 mmol/L   Glucose, Bld 102 (H) 65 - 99 mg/dL   BUN 9 6 - 20 mg/dL   Creatinine, Ser 0.69 0.44 - 1.00 mg/dL   Calcium 8.9 8.9 - 10.3 mg/dL   Total Protein 6.5 6.5 - 8.1 g/dL   Albumin 3.5 3.5 - 5.0 g/dL   AST 14 (L) 15 - 41 U/L   ALT 13 (L) 14 - 54 U/L   Alkaline Phosphatase 47 38 - 126 U/L   Total Bilirubin 0.2 (L) 0.3 - 1.2 mg/dL   GFR calc non Af Amer >60 >60 mL/min   GFR calc Af Amer >60 >60 mL/min    Comment: (NOTE) The eGFR has been calculated using the CKD EPI equation. This calculation has not been validated in all clinical situations. eGFR's persistently <60 mL/min signify possible Chronic Kidney Disease.    Anion gap 6 5 - 15  Ethanol     Status: None   Collection Time: 02/09/17  7:28 AM  Result Value Ref Range   Alcohol, Ethyl (B) <5 <5 mg/dL    Comment:        LOWEST DETECTABLE LIMIT FOR SERUM ALCOHOL IS 5 mg/dL FOR MEDICAL PURPOSES ONLY   Rapid urine drug screen (hospital performed)     Status: Abnormal   Collection Time: 02/09/17  7:32 AM  Result Value Ref Range   Opiates NONE DETECTED NONE DETECTED   Cocaine NONE DETECTED NONE DETECTED   Benzodiazepines NONE DETECTED NONE DETECTED   Amphetamines NONE DETECTED NONE DETECTED   Tetrahydrocannabinol POSITIVE (A) NONE DETECTED   Barbiturates NONE DETECTED NONE DETECTED    Comment:        DRUG SCREEN FOR MEDICAL PURPOSES ONLY.  IF CONFIRMATION IS NEEDED FOR ANY PURPOSE, NOTIFY LAB WITHIN 5 DAYS.        LOWEST DETECTABLE LIMITS FOR URINE DRUG SCREEN Drug Class       Cutoff (ng/mL) Amphetamine      1000 Barbiturate       200 Benzodiazepine   416 Tricyclics       384 Opiates          300 Cocaine          300 THC  50   Pregnancy, urine     Status: None   Collection Time: 02/09/17  7:32 AM  Result Value Ref Range   Preg Test, Ur NEGATIVE NEGATIVE    Comment:        THE SENSITIVITY OF THIS METHODOLOGY IS >20 mIU/mL.     Current Facility-Administered Medications  Medication Dose Route Frequency Provider Last Rate Last Dose  . ARIPiprazole (ABILIFY) tablet 10 mg  10 mg Oral Daily Patrecia Pour, NP   10 mg at 02/09/17 1213  . ibuprofen (ADVIL,MOTRIN) tablet 600 mg  600 mg Oral Q6H PRN Patrecia Pour, NP      . lamoTRIgine (LAMICTAL) tablet 25 mg  25 mg Oral Daily Patrecia Pour, NP   25 mg at 02/09/17 1213  . traZODone (DESYREL) tablet 50 mg  50 mg Oral QHS PRN Patrecia Pour, NP       Current Outpatient Prescriptions  Medication Sig Dispense Refill  . ibuprofen (ADVIL,MOTRIN) 200 MG tablet Take 600 mg by mouth every 6 (six) hours as needed for fever, headache, mild pain, moderate pain or cramping.    . ARIPiprazole (ABILIFY) 10 MG tablet Take 1 tablet (10 mg total) by mouth daily. For mood control (Patient not taking: Reported on 02/01/2017) 30 tablet 0  . ARIPiprazole ER 400 MG SRER Inject 400 mg into the muscle every 30 (thirty) days. (Due on 01-11-17): For mood control (Patient not taking: Reported on 02/01/2017) 1 each 0  . bisacodyl (DULCOLAX) 5 MG EC tablet Take 1 tablet (5 mg total) by mouth daily as needed for moderate constipation. (May purchase from OTC)): For constipation (Patient not taking: Reported on 02/01/2017) 1 tablet 0  . ibuprofen (ADVIL,MOTRIN) 600 MG tablet Take 1 tablet (600 mg total) by mouth every 6 (six) hours as needed. (Patient not taking: Reported on 02/09/2017) 30 tablet 0  . lamoTRIgine (LAMICTAL) 25 MG tablet Take 1 tablet (25 mg total) by mouth daily. For mood stabilization (Patient not taking: Reported on 02/01/2017) 30 tablet 0  . polyethylene glycol powder  (GLYCOLAX/MIRALAX) powder Take 17 g by mouth daily. Until daily soft stools  OTC (Patient not taking: Reported on 02/09/2017) 119 g 0  . traZODone (DESYREL) 50 MG tablet Take 1 tablet (50 mg total) by mouth at bedtime as needed for sleep. (Patient not taking: Reported on 02/01/2017) 30 tablet 0    Musculoskeletal: Strength & Muscle Tone: within normal limits Gait & Station: normal Patient leans: N/A  Psychiatric Specialty Exam: Physical Exam  Constitutional: She is oriented to person, place, and time. She appears well-developed and well-nourished.  HENT:  Head: Normocephalic.  Neck: Normal range of motion.  Respiratory: Effort normal.  Musculoskeletal: Normal range of motion.  Neurological: She is alert and oriented to person, place, and time.  Psychiatric: Her speech is normal and behavior is normal. Judgment and thought content normal. Cognition and memory are normal. She exhibits a depressed mood.    Review of Systems  Psychiatric/Behavioral: Positive for depression.  All other systems reviewed and are negative.   Blood pressure (!) 106/56, pulse 90, temperature 98.2 F (36.8 C), temperature source Oral, resp. rate 17, height '5\' 5"'  (1.651 m), weight 59 kg (130 lb), last menstrual period 01/24/2017, SpO2 100 %.Body mass index is 21.63 kg/m.  General Appearance: Casual  Eye Contact:  Good  Speech:  Normal Rate  Volume:  Normal  Mood:  Depressed, mild  Affect:  Congruent  Thought Process:  Coherent and Descriptions of  Associations: Intact  Orientation:  Full (Time, Place, and Person)  Thought Content:  WDL and Logical  Suicidal Thoughts:  No  Homicidal Thoughts:  No  Memory:  Immediate;   Good Recent;   Good Remote;   Good  Judgement:  Fair  Insight:  Good  Psychomotor Activity:  Normal  Concentration:  Concentration: Good and Attention Span: Good  Recall:  Good  Fund of Knowledge:  Fair  Language:  Good  Akathisia:  No  Handed:  Right  AIMS (if indicated):      Assets:  Housing Leisure Time Physical Health Resilience Social Support  ADL's:  Intact  Cognition:  WNL  Sleep:        Treatment Plan Summary: Daily contact with patient to assess and evaluate symptoms and progress in treatment, Medication management and Plan bipolar affective disorder, depressed, mild:  -Crisis stabilization -Medication management:  Started Lamictal 25 mg daily for bipolar d/o, Abilify 10 mg daily for depression, and Trazodone 50 mg at bedtime for sleep PRN -Individual and substance abuse counseling  Disposition: No evidence of imminent risk to self or others at present.    Waylan Boga, NP  Patient seen face-to-face for psychiatric evaluation, chart reviewed and case discussed with the physician extender and developed treatment plan. Reviewed the information documented and agree with the treatment plan. Corena Pilgrim, MD 02/10/2017 8:56 AM

## 2017-02-10 NOTE — ED Notes (Signed)
Pt discharged home. Discharged instructions read to pt who verbalized understanding. All belongings returned to pt who signed for same. Denies SI/HI, is not delusional and not responding to internal stimuli. Escorted pt to the ED exit. Pt given bus ticket.   

## 2017-02-10 NOTE — [facility] Suicide Risk Assessment (Signed)
Suicide Risk Assessment  Discharge Assessment   [facility]   Principal Problem: Bipolar affective disorder, depressed, mild (HCC) Discharge Diagnoses:  Patient Active Problem List   Diagnosis Date Noted  . Bipolar affective disorder, depressed, mild (HCC) [F31.31] 02/09/2017    Priority: High  . Cannabis use disorder, moderate, dependence ([facility]CC) [F12.20] 12/11/2016    Priority: High    Total Time spent with patient: 45 minutes Musculoskeletal: Strength & Muscle Tone: within normal limits Gait & Station: normal Patient leans: N/A  Psychiatric Specialty Exam: Physical Exam  Constitutional: She is oriented to person, place, and time. She appears well-developed and well-nourished.  HENT:  Head: Normocephalic.  Neck: Normal range of motion.  Respiratory: Effort normal.  Musculoskeletal: Normal range of motion.  Neurological: She is alert and oriented to person, place, and time.  Psychiatric: Her speech is normal and behavior is normal. Judgment and thought content normal. Cognition and memory are normal. She exhibits a depressed mood.    Review of Systems  Psychiatric/Behavioral: Positive for depression.  All other systems reviewed and are negative.   Blood pressure (!) 106/56, pulse 90, temperature 98.2 F (36.8 C), temperature source Oral, resp. rate 17, height 5\' 5"  (1.651 m), weight 59 kg (130 lb), last menstrual period 01/24/2017, SpO2 100 %.Body mass index is 21.63 kg/m.  General Appearance: Casual  Eye Contact:  Good  Speech:  Normal Rate  Volume:  Normal  Mood:  Depressed, mild  Affect:  Congruent  Thought Process:  Coherent and Descriptions of Associations: Intact  Orientation:  Full (Time, Place, and Person)  Thought Content:  WDL and Logical  Suicidal Thoughts:  No  Homicidal Thoughts:  No  Memory:  Immediate;   Good Recent;   Good Remote;   Good  Judgement:  Fair  Insight:  Good  Psychomotor Activity:  Normal  Concentration:   Concentration: Good and Attention Span: Good  Recall:  Good  Fund of Knowledge:  Fair  Language:  Good  Akathisia:  No  Handed:  Right  AIMS (if indicated):     Assets:  Housing Leisure Time Physical Health Resilience Social Support  ADL's:  Intact  Cognition:  WNL  Sleep:       Mental Status Per Nursing Assessment::   On Admission:   depression  Demographic Factors:  Adolescent or young adult  Loss Factors: NA  Historical Factors: NA  Risk Reduction Factors:   Sense of responsibility to family, Living with another person, especially a relative and Positive social support  Continued Clinical Symptoms:  Depression, mild  Cognitive Features That Contribute To Risk:  None    Suicide Risk:  Minimal: No identifiable suicidal ideation.  Patients presenting with no risk factors but with morbid ruminations; may be classified as minimal risk based on the severity of the depressive symptoms    Plan Of Care/Follow-up recommendations:  Activity:  as tolerated Diet:  heart healhty diet  Sargent Mankey, NP 02/10/2017, 9:04 AM

## 2017-02-23 ENCOUNTER — Emergency Department (HOSPITAL_COMMUNITY)
Admission: EM | Admit: 2017-02-23 | Discharge: 2017-02-24 | Disposition: A | Discharge status: Home / Self Care | Payer: Medicaid Other | Attending: Emergency Medicine | Admitting: Emergency Medicine

## 2017-02-23 DIAGNOSIS — M79604 Pain in right leg: Secondary | ICD-10-CM | POA: Diagnosis present

## 2017-02-23 DIAGNOSIS — Z79899 Other long term (current) drug therapy: Secondary | ICD-10-CM | POA: Insufficient documentation

## 2017-02-23 DIAGNOSIS — F17200 Nicotine dependence, unspecified, uncomplicated: Secondary | ICD-10-CM | POA: Insufficient documentation

## 2017-02-24 ENCOUNTER — Encounter (HOSPITAL_COMMUNITY): Payer: Self-pay | Admitting: Emergency Medicine

## 2017-02-24 ENCOUNTER — Ambulatory Visit (HOSPITAL_COMMUNITY)
Admission: RE | Admit: 2017-02-24 | Discharge: 2017-02-24 | Disposition: A | Discharge status: Home / Self Care | Payer: Medicaid Other | Source: Ambulatory Visit | Attending: Emergency Medicine | Admitting: Emergency Medicine

## 2017-02-24 DIAGNOSIS — M79604 Pain in right leg: Secondary | ICD-10-CM | POA: Insufficient documentation

## 2017-02-24 DIAGNOSIS — M7989 Other specified soft tissue disorders: Secondary | ICD-10-CM

## 2017-02-24 MED ORDER — IBUPROFEN 200 MG PO TABS
400.0000 mg | ORAL_TABLET | Freq: Once | ORAL | Status: AC
  Administered 2017-02-24: 400 mg via ORAL
  Filled 2017-02-24: qty 2

## 2017-02-24 MED ORDER — ENOXAPARIN SODIUM 60 MG/0.6ML ~~LOC~~ SOLN
1.0000 mg/kg | Freq: Once | SUBCUTANEOUS | Status: AC
  Administered 2017-02-24: 01:00:00 60 mg via SUBCUTANEOUS
  Filled 2017-02-24: qty 0.6

## 2017-02-24 NOTE — Progress Notes (Signed)
VASCULAR LAB PRELIMINARY  PRELIMINARY  PRELIMINARY  PRELIMINARY  Right lower extremity venous duplex completed.    Preliminary report:  There is no obvious evidence of DVT or SVT noted in the right lower extremity.  Yonas Bunda, RVT 02/24/2017, 9:23 AM

## 2017-02-24 NOTE — Discharge Instructions (Signed)
Please follow with your primary care doctor in the next 2 days for a check-up. They must obtain records for further management.  ° °Do not hesitate to return to the Emergency Department for any new, worsening or concerning symptoms.  ° °

## 2017-02-24 NOTE — ED Provider Notes (Signed)
[facility]-EMERGENCY DEPT Provider Note   CSN: [id number] Arrival date & time: 02/23/17  2314     History   Chief Complaint Chief Complaint  Patient presents with  . Leg Pain    HPI   Blood pressure 108/73, pulse 70, temperature 98.3 F (36.8 C), temperature source Oral, resp. rate 18, weight 61.2 kg (135 lb), last menstrual period 02/24/2017, SpO2 100 %.  [name] is a 21 y.o. female complaining of right lower extremity cramping and swelling onset several days ago. She had a similar episode without the cramping but had swelling and was seen for this and had a negative x-ray. She denies any history of DVT/PE, chest pain or shortness of breath, hormonal birth control, recent immobilizations.  Past Medical History:  Diagnosis Date  . Bipolar 1 disorder [facility](HCC)     Patient Active Problem List   Diagnosis Date Noted  . Bipolar affective disorder, depressed, mild (HCC) 02/09/2017  . Cannabis use disorder, moderate, dependence (HCC) 12/11/2016    History reviewed. No pertinent surgical history.  OB History    No data available       Home Medications    Prior to Admission medications   Medication Sig Start Date End Date Taking? Authorizing Provider  ARIPiprazole (ABILIFY) 10 MG tablet Take 1 tablet (10 mg total) by mouth daily. For mood control 02/10/17   Charm RingsLord, Jamison Y, NP  ARIPiprazole ER 400 MG SRER Inject 400 mg into the muscle every 30 (thirty) days. (Due on 01-11-17): For mood control Patient not taking: Reported on 02/01/2017 01/11/17   Armandina StammerNwoko, Agnes I, NP  bisacodyl (DULCOLAX) 5 MG EC tablet Take 1 tablet (5 mg total) by mouth daily as needed for moderate constipation. (May purchase from OTC)): For constipation Patient not taking: Reported on 02/01/2017 12/14/16   Armandina StammerNwoko, Agnes I, NP  ibuprofen (ADVIL,MOTRIN) 200 MG tablet Take 600 mg by mouth every 6 (six) hours as needed for fever, headache, mild pain, moderate pain or cramping.    [provider]  ibuprofen  (ADVIL,MOTRIN) 600 MG tablet Take 1 tablet (600 mg total) by mouth every 6 (six) hours as needed. Patient not taking: Reported on 02/09/2017 02/01/17   Antony MaduraHumes, Kelly, PA-C  lamoTRIgine (LAMICTAL) 25 MG tablet Take 1 tablet (25 mg total) by mouth daily. For mood stabilization 02/10/17   Charm RingsLord, Jamison Y, NP  polyethylene glycol powder (GLYCOLAX/MIRALAX) powder Take 17 g by mouth daily. Until daily soft stools  OTC Patient not taking: Reported on 02/09/2017 02/01/17   Antony MaduraHumes, Kelly, PA-C  traZODone (DESYREL) 50 MG tablet Take 1 tablet (50 mg total) by mouth at bedtime as needed for sleep. 02/10/17   Charm RingsLord, Jamison Y, NP    Family History Family History  Problem Relation Age of Onset  . Drug abuse Mother     Social History Social History  Substance Use Topics  . Smoking status: Current Every Day Smoker  . Smokeless tobacco: Never Used  . Alcohol use No     Allergies   Patient has no known allergies.   Review of Systems Review of Systems  A complete review of systems was obtained and all systems are negative except as noted in the HPI and PMH.    Physical Exam Updated Vital Signs BP 108/73 (BP Location: Left Arm)   Pulse 70   Temp 98.3 F (36.8 C) (Oral)   Resp 18   Wt 61.2 kg (135 lb)   LMP 02/24/2017   SpO2 100%   BMI 22.47  kg/m   Physical Exam  Constitutional: She is oriented to person, place, and time. She appears well-developed and well-nourished. No distress.  HENT:  Head: Normocephalic and atraumatic.  Mouth/Throat: Oropharynx is clear and moist.  Eyes: Conjunctivae and EOM are normal. Pupils are equal, round, and reactive to light.  Neck: Normal range of motion.  Cardiovascular: Normal rate, regular rhythm and intact distal pulses.   Pulmonary/Chest: Effort normal and breath sounds normal.  Abdominal: Soft. There is no tenderness.  Musculoskeletal: Normal range of motion. She exhibits tenderness. She exhibits no edema.  Tender along the right calf with no  significant swelling, no skin changes and distally neurovascularly intact.  Neurological: She is alert and oriented to person, place, and time.  Skin: She is not diaphoretic.  Psychiatric: She has a normal mood and affect.  Nursing note and vitals reviewed.    ED Treatments / Results  Labs (all labs ordered are listed, but only abnormal results are displayed) Labs Reviewed - No data to display  EKG  EKG Interpretation None       Radiology No results found.  Procedures Procedures (including critical care time)  Medications Ordered in ED Medications  ibuprofen (ADVIL,MOTRIN) tablet 400 mg (not administered)  enoxaparin (LOVENOX) injection 60 mg (not administered)     Initial Impression / Assessment and Plan / ED Course  I have reviewed the triage vital signs and the nursing notes.  Pertinent labs & imaging results that were available during my care of the patient were reviewed by me and considered in my medical decision making (see chart for details).     Vitals:   02/23/17 2324 02/24/17 0014  BP: 109/70 108/73  Pulse:  70  Resp: 18 18  Temp: 98.5 F (36.9 C) 98.3 F (36.8 C)  TempSrc: Oral Oral  SpO2: 100% 100%  Weight: 61.2 kg (135 lb)     Medications  ibuprofen (ADVIL,MOTRIN) tablet 400 mg (not administered)  enoxaparin (LOVENOX) injection 60 mg (not administered)    [name] is 21 y.o. female presenting with Right calf pain and subjective swelling which I do not appreciate on my exam. No sign of cellulitis, no symptoms consistent with PE. Patient will be treated with Lovenox and given ibuprofen and crutches, she will have an outpatient venous duplex in the a.m.  Evaluation does not show pathology that would require ongoing emergent intervention or inpatient treatment. Pt is hemodynamically stable and mentating appropriately. Discussed findings and plan with patient/guardian, who agrees with care plan. All questions answered. Return precautions  discussed and outpatient follow up given.      Final Clinical Impressions(s) / ED Diagnoses   Final diagnoses:  None    New Prescriptions New Prescriptions   No medications on file     Kaylyn Lim 02/24/17 1610    Alvira Monday, MD 02/27/17 1312

## 2017-02-24 NOTE — ED Notes (Signed)
Patient is A & O x4.  She understood discharge instructions. 

## 2017-02-24 NOTE — ED Triage Notes (Signed)
Patient is A & O x4.  She is complaining of right leg pain x 3 days.  She has some swelling and sharp shooting pain.  Denies any falls or injuries.

## 2017-02-25 ENCOUNTER — Encounter (HOSPITAL_COMMUNITY): Payer: Self-pay | Admitting: Nurse Practitioner

## 2017-02-25 ENCOUNTER — Emergency Department (HOSPITAL_COMMUNITY)
Admission: EM | Admit: 2017-02-25 | Discharge: 2017-02-26 | Disposition: A | Discharge status: Home / Self Care | Payer: No Typology Code available for payment source | Attending: Emergency Medicine | Admitting: Emergency Medicine

## 2017-02-25 DIAGNOSIS — F22 Delusional disorders: Secondary | ICD-10-CM | POA: Diagnosis present

## 2017-02-25 DIAGNOSIS — R45851 Suicidal ideations: Secondary | ICD-10-CM | POA: Insufficient documentation

## 2017-02-25 DIAGNOSIS — F17200 Nicotine dependence, unspecified, uncomplicated: Secondary | ICD-10-CM | POA: Insufficient documentation

## 2017-02-25 DIAGNOSIS — F1220 Cannabis dependence, uncomplicated: Secondary | ICD-10-CM | POA: Diagnosis present

## 2017-02-25 DIAGNOSIS — F3131 Bipolar disorder, current episode depressed, mild: Secondary | ICD-10-CM | POA: Diagnosis not present

## 2017-02-25 DIAGNOSIS — Z79899 Other long term (current) drug therapy: Secondary | ICD-10-CM | POA: Insufficient documentation

## 2017-02-25 LAB — COMPREHENSIVE METABOLIC PANEL
ALT: 11 U/L — ABNORMAL LOW (ref 14–54)
ANION GAP: 7 (ref 5–15)
AST: 17 U/L (ref 15–41)
Albumin: 4 g/dL (ref 3.5–5.0)
Alkaline Phosphatase: 59 U/L (ref 38–126)
BILIRUBIN TOTAL: 0.5 mg/dL (ref 0.3–1.2)
BUN: 9 mg/dL (ref 6–20)
CO2: 28 mmol/L (ref 22–32)
Calcium: 8.9 mg/dL (ref 8.9–10.3)
Chloride: 103 mmol/L (ref 101–111)
Creatinine, Ser: 0.66 mg/dL (ref 0.44–1.00)
Glucose, Bld: 111 mg/dL — ABNORMAL HIGH (ref 65–99)
POTASSIUM: 3 mmol/L — AB (ref 3.5–5.1)
Sodium: 138 mmol/L (ref 135–145)
Total Protein: 7.2 g/dL (ref 6.5–8.1)

## 2017-02-25 LAB — CBC
HCT: 30.9 % — ABNORMAL LOW (ref 36.0–46.0)
Hemoglobin: 10 g/dL — ABNORMAL LOW (ref 12.0–15.0)
MCH: 22.7 pg — ABNORMAL LOW (ref 26.0–34.0)
MCHC: 32.4 g/dL (ref 30.0–36.0)
MCV: 70.1 fL — ABNORMAL LOW (ref 78.0–100.0)
PLATELETS: 297 10*3/uL (ref 150–400)
RBC: 4.41 MIL/uL (ref 3.87–5.11)
RDW: 15 % (ref 11.5–15.5)
WBC: 5.1 10*3/uL (ref 4.0–10.5)

## 2017-02-25 LAB — ACETAMINOPHEN LEVEL

## 2017-02-25 LAB — SALICYLATE LEVEL: Salicylate Lvl: <7 mg/dL (ref 2.8–30.0)

## 2017-02-25 LAB — RAPID URINE DRUG SCREEN, HOSP PERFORMED
Amphetamines: NOT DETECTED
BENZODIAZEPINES: NOT DETECTED
Barbiturates: NOT DETECTED
COCAINE: NOT DETECTED
Opiates: NOT DETECTED
Tetrahydrocannabinol: POSITIVE — AB

## 2017-02-25 LAB — ETHANOL

## 2017-02-25 LAB — POC URINE PREG, ED: Preg Test, Ur: NEGATIVE

## 2017-02-25 NOTE — ED Provider Notes (Signed)
[facility]-EMER[facility]Y DEPT Provider Note   CSN: [id number] Arrival date & time: 02/25/17  2040     History   Chief Complaint Chief Complaint  Patient presents with  . Suicidal    HPI [name] is a 21 y.o. female who presents emergency Department with chief complaint of paranoia and suicidal ideation. The patient has difficulty expressing herself as her thoughts are tangential and disorganized. Patient states that she felt like her friends and family were "talking about me in trying to distance themselves." She states that she got extremely angry and tried to fight one of her friends and that her mother had to hold her back. She states that she feels that everyone else can be happy except for her and that she is feeling very down and kept thinking that she should just ended it All. She states that she was taking about jumping in front of a car. She reports voices in her head that are telling her to do these things.  HPI  Past Medical History:  Diagnosis Date  . Bipolar 1 disorder [facility](HCC)     Patient Active Problem List   Diagnosis Date Noted  . Bipolar affective disorder, depressed, mild (HCC) 02/09/2017  . Cannabis use disorder, moderate, dependence (HCC) 12/11/2016    History reviewed. No pertinent surgical history.  OB History    No data available       Home Medications    Prior to Admission medications   Medication Sig Start Date End Date Taking? Authorizing Provider  ARIPiprazole (ABILIFY) 10 MG tablet Take 1 tablet (10 mg total) by mouth daily. For mood control 02/10/17  Yes Lord, Herminio HeadsJamison Y, NP  ibuprofen (ADVIL,MOTRIN) 200 MG tablet Take 600 mg by mouth every 6 (six) hours as needed for fever, headache, mild pain, moderate pain or cramping.   Yes [provider]  lisdexamfetamine (VYVANSE) 30 MG capsule Take 30 mg by mouth daily.   Yes [provider]  traZODone (DESYREL) 50 MG tablet Take 1 tablet (50 mg total) by mouth at bedtime as needed for  sleep. 02/10/17  Yes Charm RingsLord, Jamison Y, NP  ARIPiprazole ER 400 MG SRER Inject 400 mg into the muscle every 30 (thirty) days. (Due on 01-11-17): For mood control Patient not taking: Reported on 02/01/2017 01/11/17   Armandina StammerNwoko, Agnes I, NP  lamoTRIgine (LAMICTAL) 25 MG tablet Take 1 tablet (25 mg total) by mouth daily. For mood stabilization 02/10/17   Charm RingsLord, Jamison Y, NP    Family History Family History  Problem Relation Age of Onset  . Drug abuse Mother     Social History Social History  Substance Use Topics  . Smoking status: Current Every Day Smoker  . Smokeless tobacco: Never Used  . Alcohol use No     Allergies   Patient has no known allergies.   Review of Systems Review of Systems  Unable to perform ROS: Psychiatric disorder     Physical Exam Updated Vital Signs BP 110/66 (BP Location: Left Arm)   Pulse 67   Temp 98.1 F (36.7 C) (Oral)   Resp 18   LMP 02/24/2017   SpO2 100%   Physical Exam  Constitutional: She is oriented to person, place, and time. She appears well-developed and well-nourished. No distress.  HENT:  Head: Normocephalic and atraumatic.  Eyes: Conjunctivae are normal. No scleral icterus.  Neck: Normal range of motion.  Cardiovascular: Normal rate, regular rhythm and normal heart sounds.  Exam reveals no gallop and no friction rub.  No murmur heard. Pulmonary/Chest: Effort normal and breath sounds normal. No respiratory distress.  Abdominal: Soft. Bowel sounds are normal. She exhibits no distension and no mass. There is no tenderness. There is no guarding.  Neurological: She is alert and oriented to person, place, and time.  Skin: Skin is warm and dry. She is not diaphoretic.  Psychiatric: Her behavior is normal.  Nursing note and vitals reviewed.    ED Treatments / Results  Labs (all labs ordered are listed, but only abnormal results are displayed) Labs Reviewed  COMPREHENSIVE METABOLIC PANEL - Abnormal; Notable for the following:        Result Value   Potassium 3.0 (*)    Glucose, Bld 111 (*)    ALT 11 (*)    All other components within normal limits  ACETAMINOPHEN LEVEL - Abnormal; Notable for the following:    Acetaminophen (Tylenol), Serum <10 (*)    All other components within normal limits  CBC - Abnormal; Notable for the following:    Hemoglobin 10.0 (*)    HCT 30.9 (*)    MCV 70.1 (*)    MCH 22.7 (*)    All other components within normal limits  RAPID URINE DRUG SCREEN, HOSP PERFORMED - Abnormal; Notable for the following:    Tetrahydrocannabinol POSITIVE (*)    All other components within normal limits  ETHANOL  SALICYLATE LEVEL  POC URINE PREG, ED  I-STAT BETA HCG BLOOD, ED ([facility], [facility], [facility] ONLY)    EKG  EKG Interpretation None       Radiology No results found.  Procedures Procedures (including critical care time)  Medications Ordered in ED Medications  potassium chloride SA (K-DUR,KLOR-CON) CR tablet 40 mEq (not administered)  acetaminophen (TYLENOL) tablet 650 mg (not administered)  alum & mag hydroxide-simeth (MAALOX/MYLANTA) 200-200-20 MG/5ML suspension 30 mL (not administered)  ibuprofen (ADVIL,MOTRIN) tablet 600 mg (not administered)  ondansetron (ZOFRAN) tablet 4 mg (not administered)  traZODone (DESYREL) tablet 50 mg (not administered)     Initial Impression / Assessment and Plan / ED Course  I have reviewed the triage vital signs and the nursing notes.  Pertinent labs & imaging results that were available during my care of the patient were reviewed by me and considered in my medical decision making (see chart for details).      patient with hypokalemia. repleated orally. Medically clear for pscyh   Final Clinical Impressions(s) / ED Diagnoses   Final diagnoses:  Suicidal ideations    New Prescriptions New Prescriptions   No medications on file     Delos Haring 02/26/17 Rolene Course, MD 03/07/17 [phone]

## 2017-02-25 NOTE — ED Notes (Signed)
Bed: WLPT3 Expected date:  Expected time:  Means of arrival:  Comments: 

## 2017-02-25 NOTE — ED Triage Notes (Signed)
Pt states "her parents and friends are tired of her and gossiping about her, so much so she wants to end it all." The plan for that she states is "tying a string to her neck."

## 2017-02-26 MED ORDER — LAMOTRIGINE 25 MG PO TABS: 25.0000 mg | ORAL_TABLET | Freq: Every day | ORAL | 0 refills | Status: DC

## 2017-02-26 MED ORDER — TRAZODONE HCL 50 MG PO TABS
50.0000 mg | ORAL_TABLET | Freq: Every evening | ORAL | Status: DC | PRN
  Administered 2017-02-26: 50 mg via ORAL
  Filled 2017-02-26: qty 1

## 2017-02-26 MED ORDER — IBUPROFEN 200 MG PO TABS: 600.0000 mg | ORAL_TABLET | Freq: Three times a day (TID) | ORAL | Status: DC | PRN

## 2017-02-26 MED ORDER — ARIPIPRAZOLE 10 MG PO TABS: 10.0000 mg | ORAL_TABLET | Freq: Every day | ORAL | 0 refills | Status: DC

## 2017-02-26 MED ORDER — ALUM & MAG HYDROXIDE-SIMETH 200-200-20 MG/5ML PO SUSP: 30.0000 mL | Freq: Four times a day (QID) | ORAL | Status: DC | PRN

## 2017-02-26 MED ORDER — POTASSIUM CHLORIDE CRYS ER 20 MEQ PO TBCR
40.0000 meq | EXTENDED_RELEASE_TABLET | Freq: Once | ORAL | Status: AC
  Administered 2017-02-26: 40 meq via ORAL
  Filled 2017-02-26: qty 2

## 2017-02-26 MED ORDER — ONDANSETRON HCL 4 MG PO TABS: 4.0000 mg | ORAL_TABLET | Freq: Three times a day (TID) | ORAL | Status: DC | PRN

## 2017-02-26 MED ORDER — TRAZODONE HCL 50 MG PO TABS: 50.0000 mg | ORAL_TABLET | Freq: Every evening | ORAL | 0 refills | Status: DC | PRN

## 2017-02-26 MED ORDER — ACETAMINOPHEN 325 MG PO TABS
650.0000 mg | ORAL_TABLET | ORAL | Status: DC | PRN
  Administered 2017-02-26: 650 mg via ORAL
  Filled 2017-02-26: qty 2

## 2017-02-26 NOTE — [facility] Assessment (Signed)
[facility] Assessment Progress Note  Per Mojeed Akintayo, MD, this pt does not require psychiatric hospitalization at this time.  Pt is to be discharged from [facility] with recommendation to follow up with [facility].  This has been included in pt's discharge instructions.  Pt's nurse, Diane, has been notified.  Varun Jourdan, MA Triage Specialist [phone]     

## 2017-02-26 NOTE — ED Notes (Signed)
SBAR Report received from previous nurse. Pt received calm and visible on unit. Pt denies current SI/ HI, A/V H, or pain at this time, and appears otherwise stable and free of distress, pt endorses anxiety 5/10, and depression 5/10. Pt reminded of camera surveillance, q 15 min rounds, and rules of the milieu. Will continue to assess.

## 2017-02-26 NOTE — ED Notes (Signed)
Pt discharged home. Discharged instructions read to pt who verbalized understanding. All belongings returned to pt who signed for same. Denies SI/HI, is not delusional and not responding to internal stimuli. Escorted pt to the ED exit.    

## 2017-02-26 NOTE — [facility] Suicide Risk Assessment (Signed)
Suicide Risk Assessment  Discharge Assessment   [facility]   Principal Problem: Bipolar affective disorder, depressed, mild (HCC) Discharge Diagnoses:  Patient Active Problem List   Diagnosis Date Noted  . Bipolar affective disorder, depressed, mild (HCC) [F31.31] 02/09/2017    Priority: High  . Cannabis use disorder, moderate, dependence (HCC) [F12.20] 12/11/2016    Priority: High    Total Time spent with patient: 45 minutes  Musculoskeletal: Strength & Muscle Tone: within normal limits Gait & Station: normal Patient leans: N/A  Psychiatric Specialty Exam:   Blood pressure 100/68, pulse 71, temperature 98 F (36.7 C), temperature source Oral, resp. rate 18, last menstrual period 02/24/2017, SpO2 100 %.There is no height or weight on file to calculate BMI.  General Appearance: Casual  Eye Contact::  Good  Speech:  Normal Rate  Volume:  Normal  Mood:  Depressed, mild  Affect:  Blunt  Thought Process:  Coherent and Descriptions of Associations: Intact  Orientation:  Full (Time, Place, and Person)  Thought Content:  WDL and Logical  Suicidal Thoughts:  No  Homicidal Thoughts:  No  Memory:  Immediate;   Good Recent;   Good Remote;   Good  Judgement:  Fair  Insight:  Fair  Psychomotor Activity:  Normal  Concentration:  Good  Recall:  Good  Fund of Knowledge:Fair  Language: Good  Akathisia:  No  Handed:  Right  AIMS (if indicated):     Assets:  Housing Leisure Time Physical Health Resilience Social Support  Sleep:     Cognition: WNL  ADL's:  Intact   Mental Status Per Nursing Assessment::   On Admission:   Patient got upset with a friend and tried to "fight" her.  She is now calm and cooperative with no suicidal/homicidal ideations, hallucinations, or alcohol abuse.  Continues to use cannabis despite educating her the last time she was her.  [facility]  Stable for  discharge.  Demographic Factors:  Adolescent or young adult  Loss Factors: NA  Historical Factors: NA  Risk Reduction Factors:   Sense of responsibility to family, Living with another person, especially a relative, Positive social support and Positive therapeutic relationship  Continued Clinical Symptoms:  Depressed, mild  Cognitive Features That Contribute To Risk:  None    Suicide Risk:  Minimal: No identifiable suicidal ideation.  Patients presenting with no risk factors but with morbid ruminations; may be classified as minimal risk based on the severity of the depressive symptoms    Plan Of Care/Follow-up recommendations:  Activity:  as tolerated Diet:  heart healthy diet  Amanda Rehfeld, NP 02/26/2017, 11:29 AM
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
## 2017-02-26 NOTE — Discharge Instructions (Signed)
For your ongoing behavioral health needs you are advised to follow up with [facility].  New patients are seen at their walk-in clinic.  Walk-in hours are Monday - Friday from 8:00 am - 12:00 pm, and from 1:00 pm - 3:00 pm.  Walk-in patients are seen on a first come, first served basis, so try to arrive as early as possible for the best chance of being seen the same day.  There is an initial fee of $22.50: ° °     [facility] °     [address] °     [place], [place] [address] °     [phone] °

## 2017-07-15 ENCOUNTER — Encounter (HOSPITAL_COMMUNITY): Payer: Self-pay | Admitting: Emergency Medicine

## 2017-07-15 ENCOUNTER — Emergency Department (HOSPITAL_COMMUNITY)
Admission: EM | Admit: 2017-07-15 | Discharge: 2017-07-16 | Disposition: A | Discharge status: Home / Self Care | Payer: Medicaid Other | Attending: Emergency Medicine | Admitting: Emergency Medicine

## 2017-07-15 ENCOUNTER — Other Ambulatory Visit: Payer: Self-pay

## 2017-07-15 ENCOUNTER — Emergency Department (HOSPITAL_COMMUNITY): Payer: Medicaid Other

## 2017-07-15 DIAGNOSIS — M79641 Pain in right hand: Secondary | ICD-10-CM | POA: Diagnosis not present

## 2017-07-15 DIAGNOSIS — S1093XA Contusion of unspecified part of neck, initial encounter: Secondary | ICD-10-CM | POA: Insufficient documentation

## 2017-07-15 DIAGNOSIS — S7012XA Contusion of left thigh, initial encounter: Secondary | ICD-10-CM | POA: Diagnosis not present

## 2017-07-15 DIAGNOSIS — R4587 Impulsiveness: Secondary | ICD-10-CM | POA: Diagnosis not present

## 2017-07-15 DIAGNOSIS — Y999 Unspecified external cause status: Secondary | ICD-10-CM | POA: Insufficient documentation

## 2017-07-15 DIAGNOSIS — Y9389 Activity, other specified: Secondary | ICD-10-CM | POA: Diagnosis not present

## 2017-07-15 DIAGNOSIS — Z79899 Other long term (current) drug therapy: Secondary | ICD-10-CM | POA: Diagnosis not present

## 2017-07-15 DIAGNOSIS — F319 Bipolar disorder, unspecified: Secondary | ICD-10-CM | POA: Diagnosis not present

## 2017-07-15 DIAGNOSIS — S199XXA Unspecified injury of neck, initial encounter: Secondary | ICD-10-CM | POA: Diagnosis present

## 2017-07-15 DIAGNOSIS — Z23 Encounter for immunization: Secondary | ICD-10-CM | POA: Diagnosis not present

## 2017-07-15 DIAGNOSIS — Y929 Unspecified place or not applicable: Secondary | ICD-10-CM | POA: Insufficient documentation

## 2017-07-15 DIAGNOSIS — Z599 Problem related to housing and economic circumstances, unspecified: Secondary | ICD-10-CM | POA: Diagnosis not present

## 2017-07-15 DIAGNOSIS — F17290 Nicotine dependence, other tobacco product, uncomplicated: Secondary | ICD-10-CM | POA: Diagnosis not present

## 2017-07-15 DIAGNOSIS — F1220 Cannabis dependence, uncomplicated: Secondary | ICD-10-CM | POA: Diagnosis not present

## 2017-07-15 MED ORDER — TETANUS-DIPHTH-ACELL PERTUSSIS 5-2.5-18.5 LF-MCG/0.5 IM SUSP
0.5000 mL | Freq: Once | INTRAMUSCULAR | Status: AC
  Administered 2017-07-16: 0.5 mL via INTRAMUSCULAR
  Filled 2017-07-15: qty 0.5

## 2017-07-15 MED ORDER — ACETAMINOPHEN 500 MG PO TABS
1000.0000 mg | ORAL_TABLET | Freq: Once | ORAL | Status: AC
Start: 2017-07-15 — End: 2017-07-16
  Administered 2017-07-16: 1000 mg via ORAL
  Filled 2017-07-15: qty 2
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
## 2017-07-16 ENCOUNTER — Emergency Department (HOSPITAL_COMMUNITY)
Admission: EM | Admit: 2017-07-16 | Discharge: 2017-07-16 | Disposition: A | Discharge status: Home / Self Care | Payer: No Typology Code available for payment source | Attending: Emergency Medicine | Admitting: Emergency Medicine

## 2017-07-16 ENCOUNTER — Other Ambulatory Visit: Payer: Self-pay

## 2017-07-16 ENCOUNTER — Encounter (HOSPITAL_COMMUNITY): Payer: Self-pay | Admitting: Emergency Medicine

## 2017-07-16 ENCOUNTER — Ambulatory Visit (HOSPITAL_COMMUNITY)
Admission: AD | Admit: 2017-07-16 | Discharge: 2017-07-16 | Disposition: A | Discharge status: Home / Self Care | Payer: No Typology Code available for payment source | Attending: Psychiatry | Admitting: Psychiatry

## 2017-07-16 DIAGNOSIS — Z599 Problem related to housing and economic circumstances, unspecified: Secondary | ICD-10-CM | POA: Diagnosis not present

## 2017-07-16 DIAGNOSIS — F1910 Other psychoactive substance abuse, uncomplicated: Secondary | ICD-10-CM

## 2017-07-16 DIAGNOSIS — R45851 Suicidal ideations: Secondary | ICD-10-CM | POA: Diagnosis not present

## 2017-07-16 DIAGNOSIS — R4587 Impulsiveness: Secondary | ICD-10-CM

## 2017-07-16 DIAGNOSIS — F1020 Alcohol dependence, uncomplicated: Secondary | ICD-10-CM

## 2017-07-16 DIAGNOSIS — Z79899 Other long term (current) drug therapy: Secondary | ICD-10-CM | POA: Insufficient documentation

## 2017-07-16 DIAGNOSIS — F1220 Cannabis dependence, uncomplicated: Secondary | ICD-10-CM | POA: Diagnosis present

## 2017-07-16 DIAGNOSIS — F319 Bipolar disorder, unspecified: Secondary | ICD-10-CM

## 2017-07-16 DIAGNOSIS — F17210 Nicotine dependence, cigarettes, uncomplicated: Secondary | ICD-10-CM | POA: Insufficient documentation

## 2017-07-16 LAB — RAPID URINE DRUG SCREEN, HOSP PERFORMED
AMPHETAMINES: NOT DETECTED
BENZODIAZEPINES: NOT DETECTED
Barbiturates: NOT DETECTED
COCAINE: NOT DETECTED
OPIATES: NOT DETECTED
Tetrahydrocannabinol: POSITIVE — AB

## 2017-07-16 LAB — COMPREHENSIVE METABOLIC PANEL
ALBUMIN: 4.1 g/dL (ref 3.5–5.0)
ALT: 13 U/L — ABNORMAL LOW (ref 14–54)
ANION GAP: 7 (ref 5–15)
AST: 24 U/L (ref 15–41)
Alkaline Phosphatase: 61 U/L (ref 38–126)
BILIRUBIN TOTAL: 1.4 mg/dL — AB (ref 0.3–1.2)
BUN: 16 mg/dL (ref 6–20)
CO2: 25 mmol/L (ref 22–32)
Calcium: 9 mg/dL (ref 8.9–10.3)
Chloride: 103 mmol/L (ref 101–111)
Creatinine, Ser: 0.63 mg/dL (ref 0.44–1.00)
GFR calc Af Amer: >60 mL/min (ref >60)
GFR calc non Af Amer: >60 mL/min (ref >60)
GLUCOSE: 94 mg/dL (ref 65–99)
POTASSIUM: 3.2 mmol/L — AB (ref 3.5–5.1)
Sodium: 135 mmol/L (ref 135–145)
TOTAL PROTEIN: 7.5 g/dL (ref 6.5–8.1)

## 2017-07-16 LAB — I-STAT BETA HCG BLOOD, ED (MC, WL, AP ONLY): I-stat hCG, quantitative: <5 m[IU]/mL (ref <5)

## 2017-07-16 LAB — CBC
HEMATOCRIT: 35 % — AB (ref 36.0–46.0)
Hemoglobin: 11.2 g/dL — ABNORMAL LOW (ref 12.0–15.0)
MCH: 22.6 pg — ABNORMAL LOW (ref 26.0–34.0)
MCHC: 32 g/dL (ref 30.0–36.0)
MCV: 70.7 fL — ABNORMAL LOW (ref 78.0–100.0)
PLATELETS: 319 10*3/uL (ref 150–400)
RBC: 4.95 MIL/uL (ref 3.87–5.11)
RDW: 14.6 % (ref 11.5–15.5)
WBC: 9.7 10*3/uL (ref 4.0–10.5)

## 2017-07-16 LAB — SALICYLATE LEVEL

## 2017-07-16 LAB — ETHANOL

## 2017-07-16 LAB — ACETAMINOPHEN LEVEL

## 2017-07-16 MED ORDER — AMOXICILLIN-POT CLAVULANATE 875-125 MG PO TABS
1.0000 | ORAL_TABLET | Freq: Two times a day (BID) | ORAL | Status: DC
  Administered 2017-07-16: 1 via ORAL
  Filled 2017-07-16: qty 1

## 2017-07-16 MED ORDER — IBUPROFEN 200 MG PO TABS: 600.0000 mg | ORAL_TABLET | Freq: Four times a day (QID) | ORAL | Status: DC | PRN

## 2017-07-16 MED ORDER — IBUPROFEN 600 MG PO TABS: 600.0000 mg | ORAL_TABLET | Freq: Four times a day (QID) | ORAL | 0 refills | Status: DC | PRN

## 2017-07-16 MED ORDER — AMOXICILLIN-POT CLAVULANATE 875-125 MG PO TABS: 1.0000 | ORAL_TABLET | Freq: Two times a day (BID) | ORAL | 0 refills | Status: DC

## 2017-07-16 NOTE — Discharge Instructions (Signed)
For your mental health needs, you are advised to follow up with [facility].  New and returning patients are seen at their walk-in clinic.  Walk-in hours are Monday - Friday from 8:00 am - 3:00 pm.  Walk-in patients are seen on a first come, first served basis.  Try to arrive as early as possible for he best chance of being seen the same day: ° °     [facility] °     [address] °     , [place] [address] °     [phone] °

## 2017-07-16 NOTE — Discharge Instructions (Signed)
It was my pleasure taking care of you today!   Ice hand for swelling relief.   Ibuprofen as needed for pain.   Please take all of your antibiotics until finished!   Keep wounds clean.   Return to ER for new or worsening symptoms, any additional concerns.
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
## 2017-07-16 NOTE — ED Notes (Signed)
Pt. Transferred to [facility] from ED to room 37 after screening for contraband. Report to include Situation, Background, Assessment and Recommendations from [facility] RN. Pt. Oriented to unit including Q15 minute rounds as well as the security cameras for their protection. Patient is alert and oriented, warm and dry in no acute distress. Patient denies SI, and VH. Pt. States she has HI towards her cousin and hears voices saying negative things. Pt. Encouraged to let me know if needs arise.

## 2017-07-16 NOTE — ED Notes (Signed)
Pt discharged safely with resources and discharge instructions.  All belongings were returned to patient and bus pass was given.
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
## 2017-07-16 NOTE — ED Notes (Signed)
Hourly rounding reveals patient sleeping in room. No complaints, stable, in no acute distress. Q15 minute rounds and monitoring via Security Cameras to continue. 
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
## 2017-07-16 NOTE — BH Assessment (Signed)
[facility] Assessment Progress Note  Per Mojeed Akintayo, MD, this pt does not require psychiatric hospitalization at this time.  [facility] be [facility] from [facility] with recommendation to follow up with [facility].  This has been included in pt's discharge instructions.  Pt's nurse, Edie, has been notified.  Eva Vallee, MA Triage Specialist [phone]     
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
## 2018-01-03 ENCOUNTER — Other Ambulatory Visit: Payer: Self-pay

## 2018-01-03 ENCOUNTER — Encounter (HOSPITAL_COMMUNITY): Payer: Self-pay | Admitting: Emergency Medicine

## 2018-01-03 ENCOUNTER — Emergency Department (HOSPITAL_COMMUNITY)
Admission: EM | Admit: 2018-01-03 | Discharge: 2018-01-03 | Disposition: A | Discharge status: Home / Self Care | Payer: Medicaid Other | Attending: Emergency Medicine | Admitting: Emergency Medicine

## 2018-01-03 DIAGNOSIS — F17290 Nicotine dependence, other tobacco product, uncomplicated: Secondary | ICD-10-CM | POA: Insufficient documentation

## 2018-01-03 DIAGNOSIS — L03317 Cellulitis of buttock: Secondary | ICD-10-CM | POA: Insufficient documentation

## 2018-01-03 DIAGNOSIS — L0231 Cutaneous abscess of buttock: Secondary | ICD-10-CM

## 2018-01-03 MED ORDER — NAPROXEN 500 MG PO TABS: 500.0000 mg | ORAL_TABLET | Freq: Two times a day (BID) | ORAL | 0 refills | Status: DC

## 2018-01-03 MED ORDER — DOXYCYCLINE HYCLATE 100 MG PO CAPS: 100.0000 mg | ORAL_CAPSULE | Freq: Two times a day (BID) | ORAL | 0 refills | Status: DC

## 2018-01-03 MED ORDER — LIDOCAINE-EPINEPHRINE (PF) 2 %-1:200000 IJ SOLN
10.0000 mL | Freq: Once | INTRAMUSCULAR | Status: AC
  Administered 2018-01-03: 10 mL
  Filled 2018-01-03: qty 20
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
## 2018-01-03 NOTE — Discharge Instructions (Addendum)
You were seen in the emergency department today for an abscess.  This was incised and drained.  We are placing you on antibiotics to help with the infection.  Your antibiotic is doxycycline, take this once in the morning and once in the evening, be sure to take this with food to avoid upset stomach.  Please take all of your antibiotics until finished. You may develop abdominal discomfort or diarrhea from the antibiotic.  You may help offset this with probiotics which you can buy at the store (ask your pharmacist if unable to find) or get probiotics in the form of eating yogurt. Do not eat or take the probiotics until 2 hours after your antibiotic. If you are unable to tolerate these side effects follow-up with your primary care provider or return to the emergency department.   If you begin to experience any blistering, rashes, swelling, or difficulty breathing seek medical care for evaluation of potentially more serious side effects.   Please be aware that this medication may interact with other medications you are taking, please be sure to discuss your medication list with your pharmacist. If you are taking birth control the antibiotic will deactivate your birth control for 2 weeks.   Additionally prescribed naproxen for pain. Naproxen is a nonsteroidal anti-inflammatory medication that will help with pain and swelling. Be sure to take this medication as prescribed with food, 1 pill every 12 hours,  It should be taken with food, as it can cause stomach upset, and more seriously, stomach bleeding. Do not take other nonsteroidal anti-inflammatory medications with this such as Advil, Motrin, or Aleve.  You may take Tylenol in addition to this per over-the-counter dosing.  Apply warm compresses to the area.  Please have this area rechecked in 2 days.  You  may go to urgent care, the ER, or your primary care provider for this.  Return to the ER sooner for any new or worsening symptoms or any other  concerns.

## 2018-01-03 NOTE — ED Notes (Signed)
Pt. Reports that she saw a spider and a few days later noticed a bump on her bottom that was itching then became sore. Pt c/o pain when sitting on area.

## 2018-01-03 NOTE — ED Provider Notes (Signed)
[facility] EMERGENCY DEPARTMENT Provider Note   CSN: [id number] Arrival date & time: 01/03/18  0751     History   Chief Complaint Chief Complaint  Patient presents with  . Insect Bite    HPI [name] is a 22 y.o. female with a hx of tobacco use and bipolar disorder who presents to the ED with concern for spider bite to R buttocks that she believes occurred 5 days ago- states painful bump to this location at present. Patient states that she saw a spider prior to going to sleep, she initially brushed it away, thought it was dead, next morning she woke up and had some itching/irritation to R buttocks region. She does not recall the spider actually biting her- no witnessed bite or sensation of bite. She states that it was more her mother that believed this was the cause. She states that she initially had irritation/itching, has developed discomfort and a bump. States pain is a 0/10 unless she sits down then its a 7/10 in severity. No other specific alleviating/aggravating factors. Denies fever, chills, nausea, vomiting, or drainage.   HPI  Past Medical History:  Diagnosis Date  . Bipolar 1 disorder [facility])     Patient Active Problem List   Diagnosis Date Noted  . Bipolar affective disorder, depressed, mild (HCC) 02/09/2017  . Cannabis use disorder, moderate, dependence (HCC) 12/11/2016    History reviewed. No pertinent surgical history.   OB History   None      Home Medications    Prior to Admission medications   Medication Sig Start Date End Date Taking? Authorizing Provider  amoxicillin-clavulanate (AUGMENTIN) 875-125 MG tablet Take 1 tablet every 12 (twelve) hours by mouth. Patient not taking: Reported on 07/16/2017 07/16/17   Ward, Chase Picket, PA-C  ARIPiprazole (ABILIFY) 10 MG tablet Take 1 tablet (10 mg total) by mouth daily. For mood control Patient not taking: Reported on 07/16/2017 02/26/17   Charm Rings, NP  ibuprofen (ADVIL,MOTRIN)  600 MG tablet Take 1 tablet (600 mg total) every 6 (six) hours as needed by mouth. 07/16/17   Ward, Chase Picket, PA-C  lamoTRIgine (LAMICTAL) 25 MG tablet Take 1 tablet (25 mg total) by mouth daily. For mood stabilization Patient not taking: Reported on 07/16/2017 02/26/17   Charm Rings, NP  traZODone (DESYREL) 50 MG tablet Take 1 tablet (50 mg total) by mouth at bedtime as needed for sleep. Patient not taking: Reported on 07/16/2017 02/26/17   Charm Rings, NP    Family History Family History  Problem Relation Age of Onset  . Drug abuse Mother     Social History Social History   Tobacco Use  . Smoking status: Current Some Day Smoker    Types: Cigars  . Smokeless tobacco: Never Used  Substance Use Topics  . Alcohol use: Yes    Comment: occ  . Drug use: Yes    Types: Marijuana     Allergies   Patient has no known allergies.   Review of Systems Review of Systems  Constitutional: Negative for chills and fever.  Gastrointestinal: Negative for nausea and vomiting.  Skin:       Positive for bump to R buttocks    Physical Exam Updated Vital Signs BP 91/67 (BP Location: Right Arm)   Pulse (!) 58   Temp 98.2 F (36.8 C) (Oral)   Resp 16   SpO2 100%   Physical Exam  Constitutional: She appears well-developed and well-nourished. No distress.  HENT:  Head: Normocephalic and atraumatic.  Eyes: Conjunctivae are normal. Right eye exhibits no discharge. Left eye exhibits no discharge.  Neurological: She is alert.  Clear speech.   Skin:     Psychiatric: She has a normal mood and affect. Her behavior is normal. Thought content normal.  Nursing note and vitals reviewed.    ED Treatments / Results  Labs (all labs ordered are listed, but only abnormal results are displayed) Labs Reviewed - No data to display  EKG None  Radiology No results found.  Procedures .Marland KitchenIncision and Drainage Date/Time: 01/03/2018 11:29 AM Performed by: Cherly Anderson,  PA-C Authorized by: Cherly Anderson, PA-C   Consent:    Consent obtained:  Verbal   Consent given by:  Patient   Risks discussed:  Bleeding, incomplete drainage, pain, damage to other organs and infection   Alternatives discussed:  No treatment Location:    Type:  Abscess   Size:  1 cm fluctuance, 2 cm induration   Location:  Lower extremity   Lower extremity location:  Buttock   Buttock location:  R buttock Pre-procedure details:    Skin preparation:  Betadine Anesthesia (see MAR for exact dosages):    Anesthesia method:  Local infiltration   Local anesthetic:  Lidocaine 2% WITH epi Procedure details:    Incision types:  Stab incision   Scalpel blade:  11   Wound management:  Probed and deloculated and irrigated with saline   Drainage:  Bloody and purulent   Drainage amount: mild.   Wound treatment:  Wound left open   Packing materials:  None Post-procedure details:    Patient tolerance of procedure:  Tolerated well, no immediate complications   (including critical care time)  Medications Ordered in ED Medications - No data to display   Initial Impression / Assessment and Plan / ED Course  I have reviewed the triage vital signs and the nursing notes.  Pertinent labs & imaging results that were available during my care of the patient were reviewed by me and considered in my medical decision making (see chart for details).    Patient with skin abscess amenable to incision and drainage.  Abscess was not large enough to warrant packing or drain. Given surrounding induration with only mild amount of drainage with I&D will place patient on Doxycyline. Encouraged home warm soaks and flushing. Will give prescription for Naproxen for discomfort.  Instructed recheck in 2 days. I discussed results, treatment plan, need for follow-up, and return precautions with the patient. Provided opportunity for questions, patient confirmed understanding and is in agreement with plan.    Final Clinical Impressions(s) / ED Diagnoses   Final diagnoses:  Abscess and cellulitis of gluteal region    ED Discharge Orders        Ordered    naproxen (NAPROSYN) 500 MG tablet  2 times daily     01/03/18 1133    doxycycline (VIBRAMYCIN) 100 MG capsule  2 times daily     01/03/18 [address] [place] R, PA-C 01/03/18 1149    Vanetta Mulders, MD 01/03/18 1617

## 2018-06-11 ENCOUNTER — Encounter (HOSPITAL_COMMUNITY): Payer: Self-pay | Admitting: *Deleted

## 2018-06-11 ENCOUNTER — Emergency Department (HOSPITAL_COMMUNITY)
Admission: EM | Admit: 2018-06-11 | Discharge: 2018-06-11 | Disposition: A | Discharge status: Home / Self Care | Payer: Medicaid Other | Attending: Emergency Medicine | Admitting: Emergency Medicine

## 2018-06-11 DIAGNOSIS — H6691 Otitis media, unspecified, right ear: Secondary | ICD-10-CM | POA: Insufficient documentation

## 2018-06-11 DIAGNOSIS — F17290 Nicotine dependence, other tobacco product, uncomplicated: Secondary | ICD-10-CM | POA: Insufficient documentation

## 2018-06-11 DIAGNOSIS — Z79899 Other long term (current) drug therapy: Secondary | ICD-10-CM | POA: Insufficient documentation

## 2018-06-11 DIAGNOSIS — H6690 Otitis media, unspecified, unspecified ear: Secondary | ICD-10-CM

## 2018-06-11 MED ORDER — AMOXICILLIN-POT CLAVULANATE 875-125 MG PO TABS: 1.0000 | ORAL_TABLET | Freq: Two times a day (BID) | ORAL | 0 refills | Status: DC

## 2018-06-11 MED ORDER — AMOXICILLIN-POT CLAVULANATE 875-125 MG PO TABS: 1.0000 | ORAL_TABLET | Freq: Two times a day (BID) | ORAL | 0 refills | Status: AC

## 2018-06-11 NOTE — ED Provider Notes (Signed)
[facility] EMERGENCY DEPARTMENT Provider Note  CSN: [id number] Arrival date & time: 06/11/18  1247  History   Chief Complaint Chief Complaint  Patient presents with  . Otalgia    HPI [name] is a 22 y.o. female with no significant medical history who presented to the ED for right otalgia x4 months. She reports pain first began after jumping into a pool in 01/2018. She reports after that that she had bleeding from her right ear which resolved. Patient denies that she had any medical evaluation following that. Since then, she reports intermittent right otalgia that has no specific worsening or relieving factors. She reports worsening of this pain over the last week and also has accompanying upper respiratory complaints. Patient has not tried any intervention or treatment for the otalgia or upper respiratory complaints prior to today.  Otalgia  There is pain in the right ear. There has been no fever. The pain is mild. Associated symptoms include ear discharge. Pertinent negatives include no rhinorrhea, no sore throat and no neck pain. Hearing loss: ear fullness. Associated symptoms comments: Congestion. Her past medical history does not include chronic ear infection, hearing loss or tympanostomy tube.    Past Medical History:  Diagnosis Date  . Bipolar 1 disorder [facility])     Patient Active Problem List   Diagnosis Date Noted  . Bipolar affective disorder, depressed, mild (HCC) 02/09/2017  . Cannabis use disorder, moderate, dependence (HCC) 12/11/2016    History reviewed. No pertinent surgical history.   OB History   None      Home Medications    Prior to Admission medications   Medication Sig Start Date End Date Taking? Authorizing Provider  amoxicillin-clavulanate (AUGMENTIN) 875-125 MG tablet Take 1 tablet by mouth 2 (two) times daily for 7 days. 06/11/18 06/18/18  Mortis, Jerrel Ivory I, PA-C  doxycycline (VIBRAMYCIN) 100 MG capsule Take 1 capsule  (100 mg total) by mouth 2 (two) times daily. 01/03/18   Petrucelli, Samantha R, PA-C  naproxen (NAPROSYN) 500 MG tablet Take 1 tablet (500 mg total) by mouth 2 (two) times daily. 01/03/18   Petrucelli, Pleas Koch, PA-C    Family History Family History  Problem Relation Age of Onset  . Drug abuse Mother     Social History Social History   Tobacco Use  . Smoking status: Current Some Day Smoker    Types: Cigars  . Smokeless tobacco: Never Used  Substance Use Topics  . Alcohol use: Yes    Comment: occ  . Drug use: Yes    Types: Marijuana     Allergies   Patient has no known allergies.   Review of Systems Review of Systems  Constitutional: Negative.   HENT: Positive for ear discharge and ear pain. Negative for rhinorrhea and sore throat. Hearing loss: ear fullness.   Eyes: Negative.   Musculoskeletal: Negative for neck pain.  Skin: Negative.   Neurological: Negative.  Negative for dizziness and light-headedness.     Physical Exam Updated Vital Signs BP 111/72 (BP Location: Right Arm)   Pulse 66   Temp 98.7 F (37.1 C) (Oral)   Resp 16   Ht 5\' 8"  (1.727 m)   Wt 59 kg   LMP 05/22/2018 (Approximate)   SpO2 100%   BMI 19.77 kg/m   Physical Exam  Constitutional: Vital signs are normal. She appears well-developed and well-nourished. She is cooperative. No distress.  HENT:  Head: Normocephalic and atraumatic.  Right Ear: There is drainage and swelling. No  mastoid tenderness. Tympanic membrane is bulging. Tympanic membrane is not perforated. A middle ear effusion is present. No hemotympanum.  Left Ear: Tympanic membrane, external ear and ear canal normal.  Neck: Normal range of motion and full passive range of motion without pain. Neck supple. No spinous process tenderness and no muscular tenderness present. Normal range of motion present.  Neurological: She is alert.  Skin: Skin is warm and intact. Capillary refill takes less than 2 seconds.  Nursing note and vitals  reviewed.  ED Treatments / Results  Labs (all labs ordered are listed, but only abnormal results are displayed) Labs Reviewed - No data to display  EKG None  Radiology No results found.  Procedures Procedures (including critical care time)  Medications Ordered in ED Medications - No data to display   Initial Impression / Assessment and Plan / ED Course  Triage vital signs and the nursing notes have been reviewed.  Pertinent labs & imaging results that were available during care of the patient were reviewed and considered in medical decision making (see chart for details).   Patient presents with intermittent right otalgia that has been present since 01/2018, but states it has worsened over the last week and has accompanying upper respiratory complaints. Patient's history is suggestive of barotrauma, but patient was never evaluated at the time of the accident. On exam, otitis media is present with effusion visualized. No s/s of mastoiditis or other HENT issue.  Final Clinical Impressions(s) / ED Diagnoses  1. Acute Otitis Media with Effusion. Rx for Amoxicillin prescribed. Advised to follow-up with ENT given history of ear trauma and effusion present.  Dispo: Home. After thorough clinical evaluation, this patient is determined to be medically stable and can be safely discharged with the previously mentioned treatment and/or outpatient follow-up/referral(s). At this time, there are no other apparent medical conditions that require further screening, evaluation or treatment.   Final diagnoses:  Acute otitis media, unspecified otitis media type    ED Discharge Orders         Ordered    amoxicillin-clavulanate (AUGMENTIN) 875-125 MG tablet  2 times daily,   Status:  Discontinued     06/11/18 1503    amoxicillin-clavulanate (AUGMENTIN) 875-125 MG tablet  2 times daily     06/11/18 [address], [place] I, PA-C 06/11/18 1609    Cathren Laine, MD 06/17/18  2314

## 2018-06-11 NOTE — Discharge Instructions (Addendum)
Your physical exam today is consistent with a middle ear infection. I also saw some fluid behind the ear. Given that you probably injured your ear drum back in June, it is hard to tell how long the fluid has been there.   I have prescribed you antibiotics, Augmentin, to take for the infection. Be sure to take the full course of antibiotics. You will also need to follow-up with an ear specialist to evaluate the fluid behind the ear.  Thank you for allowing [place] to take care of you today.

## 2018-06-11 NOTE — ED Triage Notes (Signed)
Pt g/o R ear pain intermittent since June, pt reports bleeding in June when she jumped in the pool, pt reports difficulty hearing, pt reports cleaning it with a Q tip with some relief, A&O x4, pt reports last time the R ear bled was a month ago, A&O x4

## 2018-06-11 NOTE — ED Notes (Signed)
ED Provider at bedside. 

## 2018-06-11 NOTE — ED Notes (Signed)
Pt reports intermittent CP that has been going on since high school, denies current CP

## 2018-06-24 ENCOUNTER — Other Ambulatory Visit: Payer: Self-pay

## 2018-06-24 ENCOUNTER — Inpatient Hospital Stay (HOSPITAL_COMMUNITY)
Admission: EM | Admit: 2018-06-24 | Discharge: 2018-06-28 | DRG: 603 | Disposition: A | Discharge status: Home / Self Care | Payer: Self-pay | Attending: Internal Medicine | Admitting: Internal Medicine

## 2018-06-24 ENCOUNTER — Emergency Department (HOSPITAL_COMMUNITY): Payer: Self-pay

## 2018-06-24 ENCOUNTER — Encounter (HOSPITAL_COMMUNITY): Payer: Self-pay | Admitting: *Deleted

## 2018-06-24 ENCOUNTER — Ambulatory Visit (HOSPITAL_BASED_OUTPATIENT_CLINIC_OR_DEPARTMENT_OTHER): Payer: Self-pay

## 2018-06-24 DIAGNOSIS — L03115 Cellulitis of right lower limb: Principal | ICD-10-CM | POA: Diagnosis present

## 2018-06-24 DIAGNOSIS — F17290 Nicotine dependence, other tobacco product, uncomplicated: Secondary | ICD-10-CM | POA: Diagnosis present

## 2018-06-24 DIAGNOSIS — W010XXA Fall on same level from slipping, tripping and stumbling without subsequent striking against object, initial encounter: Secondary | ICD-10-CM | POA: Diagnosis present

## 2018-06-24 DIAGNOSIS — K5900 Constipation, unspecified: Secondary | ICD-10-CM | POA: Diagnosis not present

## 2018-06-24 DIAGNOSIS — D72829 Elevated white blood cell count, unspecified: Secondary | ICD-10-CM | POA: Diagnosis present

## 2018-06-24 DIAGNOSIS — R609 Edema, unspecified: Secondary | ICD-10-CM

## 2018-06-24 DIAGNOSIS — D649 Anemia, unspecified: Secondary | ICD-10-CM | POA: Diagnosis present

## 2018-06-24 DIAGNOSIS — L02415 Cutaneous abscess of right lower limb: Secondary | ICD-10-CM | POA: Diagnosis present

## 2018-06-24 DIAGNOSIS — D509 Iron deficiency anemia, unspecified: Secondary | ICD-10-CM | POA: Diagnosis present

## 2018-06-24 DIAGNOSIS — F319 Bipolar disorder, unspecified: Secondary | ICD-10-CM | POA: Diagnosis present

## 2018-06-24 DIAGNOSIS — F3131 Bipolar disorder, current episode depressed, mild: Secondary | ICD-10-CM | POA: Diagnosis present

## 2018-06-24 LAB — CBC WITH DIFFERENTIAL/PLATELET
Abs Immature Granulocytes: 0.23 10*3/uL — ABNORMAL HIGH (ref 0.00–0.07)
Basophils Absolute: 0 10*3/uL (ref 0.0–0.1)
Basophils Relative: 0 %
Eosinophils Absolute: 0 10*3/uL (ref 0.0–0.5)
Eosinophils Relative: 0 %
HCT: 34.1 % — ABNORMAL LOW (ref 36.0–46.0)
Hemoglobin: 10.7 g/dL — ABNORMAL LOW (ref 12.0–15.0)
Immature Granulocytes: 1 %
Lymphocytes Relative: 12 %
Lymphs Abs: 2.1 10*3/uL (ref 0.7–4.0)
MCH: 22.3 pg — ABNORMAL LOW (ref 26.0–34.0)
MCHC: 31.4 g/dL (ref 30.0–36.0)
MCV: 71.2 fL — ABNORMAL LOW (ref 80.0–100.0)
Monocytes Absolute: 1.2 10*3/uL — ABNORMAL HIGH (ref 0.1–1.0)
Monocytes Relative: 7 %
Neutro Abs: 13.3 10*3/uL — ABNORMAL HIGH (ref 1.7–7.7)
Neutrophils Relative %: 80 %
Platelets: 432 10*3/uL — ABNORMAL HIGH (ref 150–400)
RBC: 4.79 MIL/uL (ref 3.87–5.11)
RDW: 14.4 % (ref 11.5–15.5)
WBC: 16.9 10*3/uL — ABNORMAL HIGH (ref 4.0–10.5)
nRBC: 0 % (ref 0.0–0.2)

## 2018-06-24 LAB — BASIC METABOLIC PANEL
Anion gap: 9 (ref 5–15)
BUN: 6 mg/dL (ref 6–20)
CO2: 26 mmol/L (ref 22–32)
Calcium: 9.1 mg/dL (ref 8.9–10.3)
Chloride: 103 mmol/L (ref 98–111)
Creatinine, Ser: 0.67 mg/dL (ref 0.44–1.00)
GFR calc Af Amer: >60 mL/min (ref >60)
GFR calc non Af Amer: >60 mL/min (ref >60)
Glucose, Bld: 95 mg/dL (ref 70–99)
Potassium: 3.7 mmol/L (ref 3.5–5.1)
Sodium: 138 mmol/L (ref 135–145)

## 2018-06-24 LAB — I-STAT CG4 LACTIC ACID, ED: Lactic Acid, Venous: 1 mmol/L (ref 0.5–1.9)

## 2018-06-24 MED ORDER — CEFAZOLIN SODIUM-DEXTROSE 1-4 GM/50ML-% IV SOLN
1.0000 g | Freq: Three times a day (TID) | INTRAVENOUS | Status: DC
  Administered 2018-06-25 – 2018-06-27 (×8): 1 g via INTRAVENOUS
  Filled 2018-06-24 (×10): qty 50

## 2018-06-24 MED ORDER — SODIUM CHLORIDE 0.9 % IV BOLUS
1000.0000 mL | Freq: Once | INTRAVENOUS | Status: AC
  Administered 2018-06-24: 1000 mL via INTRAVENOUS

## 2018-06-24 MED ORDER — CEFAZOLIN SODIUM-DEXTROSE 1-4 GM/50ML-% IV SOLN
1.0000 g | Freq: Once | INTRAVENOUS | Status: AC
  Administered 2018-06-24: 1 g via INTRAVENOUS
  Filled 2018-06-24: qty 50

## 2018-06-24 MED ORDER — ONDANSETRON HCL 4 MG PO TABS: 4.0000 mg | ORAL_TABLET | Freq: Four times a day (QID) | ORAL | Status: DC | PRN

## 2018-06-24 MED ORDER — OXYCODONE-ACETAMINOPHEN 5-325 MG PO TABS
1.0000 | ORAL_TABLET | Freq: Once | ORAL | Status: AC
  Administered 2018-06-24: 1 via ORAL
  Filled 2018-06-24: qty 1

## 2018-06-24 MED ORDER — SODIUM CHLORIDE 0.9 % IV SOLN
INTRAVENOUS | Status: DC
  Administered 2018-06-24: 23:00:00 via INTRAVENOUS

## 2018-06-24 MED ORDER — ONDANSETRON HCL 4 MG/2ML IJ SOLN: 4.0000 mg | Freq: Four times a day (QID) | INTRAMUSCULAR | Status: DC | PRN

## 2018-06-24 MED ORDER — KETOROLAC TROMETHAMINE 15 MG/ML IJ SOLN
15.0000 mg | Freq: Four times a day (QID) | INTRAMUSCULAR | Status: DC | PRN
  Administered 2018-06-24 – 2018-06-25 (×3): 15 mg via INTRAVENOUS
  Filled 2018-06-24 (×3): qty 1

## 2018-06-24 NOTE — ED Provider Notes (Signed)
[facility] EMERGENCY DEPARTMENT Provider Note   CSN: [id number] Arrival date & time: 06/24/18  1015     History   Chief Complaint Chief Complaint  Patient presents with  . Foot Pain    HPI [name] is a 22 y.o. female.  HPI    22 year old female presents today with complaints of right foot and ankle pain.  Patient notes approximately 1 week ago she was walking when she stumbled and fell.  She notes pain to her right great toe and bottom of her foot.  She notes since that time she has had progressive swelling and edema to the foot and ankle and distal extremity.  She notes that she has to walk everywhere she goes which worsens her symptoms.  She denies any fever nausea or vomiting.  Patient notes that she does have a history of swelling to the lower extremity although with it has not been severe in the past.   Past Medical History:  Diagnosis Date  . Bipolar 1 disorder [facility])     Patient Active Problem List   Diagnosis Date Noted  . Cellulitis and abscess of right leg 06/24/2018  . Leucocytosis 06/24/2018  . Anemia 06/24/2018  . Bipolar affective disorder, depressed, mild (HCC) 02/09/2017  . Cannabis use disorder, moderate, dependence (HCC) 12/11/2016    History reviewed. No pertinent surgical history.   OB History   None      Home Medications    Prior to Admission medications   Medication Sig Start Date End Date Taking? Authorizing Provider  aspirin-acetaminophen-caffeine (EXCEDRIN MIGRAINE) [phone] MG tablet Take 3 tablets by mouth 2 (two) times daily as needed for headache or migraine.   Yes [provider]  naproxen (NAPROSYN) 500 MG tablet Take 1 tablet (500 mg total) by mouth 2 (two) times daily. Patient not taking: Reported on 06/24/2018 01/03/18   Petrucelli, Pleas Koch, PA-C    Family History Family History  Problem Relation Age of Onset  . Drug abuse Mother     Social History Social History   Tobacco Use    . Smoking status: Current Some Day Smoker    Types: Cigars  . Smokeless tobacco: Never Used  Substance Use Topics  . Alcohol use: Yes    Comment: occ  . Drug use: Yes    Types: Marijuana     Allergies   Patient has no known allergies.   Review of Systems Review of Systems  All other systems reviewed and are negative.    Physical Exam Updated Vital Signs BP (!) 103/58   Pulse 80   Temp 98.3 F (36.8 C) (Oral)   Resp 18   Ht 5\' 8"  (1.727 m)   Wt 59 kg   SpO2 100%   BMI 19.77 kg/m   Physical Exam  Constitutional: She is oriented to person, place, and time. She appears well-developed and well-nourished.  HENT:  Head: Normocephalic and atraumatic.  Eyes: Pupils are equal, round, and reactive to light. Conjunctivae are normal. Right eye exhibits no discharge. Left eye exhibits no discharge. No scleral icterus.  Neck: Normal range of motion. No JVD present. No tracheal deviation present.  Pulmonary/Chest: Effort normal. No stridor.  Musculoskeletal:  Several small abrasion noted to right medial great toe   Neurological: She is alert and oriented to person, place, and time. Coordination normal.  Psychiatric: She has a normal mood and affect. Her behavior is normal. Judgment and thought content normal.  Nursing note and vitals reviewed.  ED Treatments / Results  Labs (all labs ordered are listed, but only abnormal results are displayed) Labs Reviewed  CBC WITH DIFFERENTIAL/PLATELET - Abnormal; Notable for the following components:      Result Value   WBC 16.9 (*)    Hemoglobin 10.7 (*)    HCT 34.1 (*)    MCV 71.2 (*)    MCH 22.3 (*)    Platelets 432 (*)    Neutro Abs 13.3 (*)    Monocytes Absolute 1.2 (*)    Abs Immature Granulocytes 0.23 (*)    All other components within normal limits  CULTURE, BLOOD (ROUTINE X 2)  CULTURE, BLOOD (ROUTINE X 2)  BASIC METABOLIC PANEL  I-STAT CG4 LACTIC ACID, ED  I-STAT CG4 LACTIC ACID, ED     EKG None  Radiology Dg Ankle Complete Right  Result Date: 06/24/2018 CLINICAL DATA:  Right foot and ankle swelling after suffering a fall 1 week ago. EXAM: RIGHT ANKLE - COMPLETE 3+ VIEW COMPARISON:  Right foot radiographs-earlier same date FINDINGS: There is diffuse soft tissue swelling about the ankle and hindfoot. No associated fracture or dislocation. Joint spaces are preserved. Ankle mortise is preserved. No definite ankle joint effusion. No radiopaque foreign body. IMPRESSION: Diffuse soft tissue swelling about the ankle and hindfoot without associated fracture, dislocation or radiopaque foreign body. Electronically Signed   By: Simonne Come M.D.   On: 06/24/2018 11:07   Dg Foot Complete Right  Result Date: 06/24/2018 CLINICAL DATA:  Right foot and ankle swelling after suffering a fall 1 week ago. EXAM: RIGHT FOOT COMPLETE - 3+ VIEW COMPARISON:  Right ankle radiographs-earlier same day FINDINGS: There is diffuse soft tissue swelling about the foot and ankle. No associated fracture or radiopaque foreign body. Mild hallux valgus deformity without associated degenerative change. Joint spaces are preserved. No erosions. No radiopaque foreign body. IMPRESSION: Nonspecific diffuse soft tissue swelling about the foot and ankle without associated fracture, dislocation or radiopaque foreign body. Electronically Signed   By: Simonne Come M.D.   On: 06/24/2018 11:08    Procedures Procedures (including critical care time)  Medications Ordered in ED Medications  sodium chloride 0.9 % bolus 1,000 mL (0 mLs Intravenous Stopped 06/24/18 2005)  ceFAZolin (ANCEF) IVPB 1 g/50 mL premix (0 g Intravenous Stopped 06/24/18 1909)  oxyCODONE-acetaminophen (PERCOCET/ROXICET) 5-325 MG per tablet 1 tablet (1 tablet Oral Given 06/24/18 1939)     Initial Impression / Assessment and Plan / ED Course  I have reviewed the triage vital signs and the nursing notes.  Pertinent labs & imaging results that were  available during my care of the patient were reviewed by me and considered in my medical decision making (see chart for details).     Labs: I-STAT lactic acid, CBC, BMP  Imaging: DG ankle complete right  Consults: [facility]  Therapeutics: Ance[employer]  Discharge Meds:   Assessment/Plan: 22 year old female presents today with cellulitis to her right lower extremity.  She has significant lower extremity swelling and erythema.  Patient afebrile, elevated white count at 16 9, normal lactic.  Given the extensive area of infection recommend hospitalization for IV antibiotics.   Final Clinical Impressions(s) / ED Diagnoses   Final diagnoses:  Cellulitis of right lower extremity    ED Discharge Orders    None       Rosalio Loud 06/24/18 2053    Vanetta Mulders, MD 06/24/18 2300

## 2018-06-24 NOTE — H&P (Signed)
History and Physical   [name] ZOX:[id number] DOB: 05-12-1996 DOA: 06/24/2018  Referring MD/NP/PA: Dr. Deretha Emory  PCP: Patient, No Pcp Per   Outpatient Specialists: None   Patient coming from: Home  Chief Complaint: right leg swelling and pain  HPI: [name] is a 22 y.o. female with medical history significant of Bipolar disorder, history of marijuana abuse who apparently hurt her leg about a week ago.  She is not sure how but she noted a scrape and opening on her right foot.  Since then the foot has been swelling getting more painful.  She tried to endure the pain but the pain was persistent so she decided to come to the ER today.  Patient's whole right leg is swollen.  She has 2 pinpoint areas on the medial side of her big toe that a prescription.  Evaluations showed no bony involvement but mainly soft tissue swelling with no drainable abscess.  Patient is being admitted for IV antibiotic treatment..  ED Course: Temperature is 98.3 blood pressure 93/54 pulse 101 respiratory rate of 20 oxygen sat 100% room air.  White count is 16.9 and hemoglobin 10.7.  Lactic acid of 1.0 and platelets 432 chemistry appears to be within normal.  X-ray of the neck showed no evidence of bony involvement.  Patient has been initiated on IV antibiotics and is being admitted for treatment  Review of Systems: As per HPI otherwise 10 point review of systems negative.    Past Medical History:  Diagnosis Date  . Bipolar 1 disorder (HCC)     History reviewed. No pertinent surgical history.   reports that she has been smoking cigars. She has never used smokeless tobacco. She reports that she drinks alcohol. She reports that she has current or past drug history. Drug: Marijuana.  No Known Allergies  Family History  Problem Relation Age of Onset  . Drug abuse Mother      Prior to Admission medications   Medication Sig Start Date End Date Taking? Authorizing Provider    aspirin-acetaminophen-caffeine (EXCEDRIN MIGRAINE) [phone] MG tablet Take 3 tablets by mouth 2 (two) times daily as needed for headache or migraine.   Yes [provider]  naproxen (NAPROSYN) 500 MG tablet Take 1 tablet (500 mg total) by mouth 2 (two) times daily. Patient not taking: Reported on 06/24/2018 01/03/18   Cherly Anderson, PA-C    Physical Exam: Vitals:   06/24/18 1024 06/24/18 1223  BP: 109/73   Pulse: (!) 101   Resp: 18   Temp: 98.3 F (36.8 C)   TempSrc: Oral   SpO2: 100%   Weight:  59 kg  Height:  5\' 8"  (1.727 m)      Constitutional: NAD, calm, comfortable Vitals:   06/24/18 1024 06/24/18 1223  BP: 109/73   Pulse: (!) 101   Resp: 18   Temp: 98.3 F (36.8 C)   TempSrc: Oral   SpO2: 100%   Weight:  59 kg  Height:  5\' 8"  (1.727 m)   Eyes: PERRL, lids and conjunctivae normal ENMT: Mucous membranes are moist. Posterior pharynx clear of any exudate or lesions.Normal dentition.  Neck: normal, supple, no masses, no thyromegaly Respiratory: clear to auscultation bilaterally, no wheezing, no crackles. Normal respiratory effort. No accessory muscle use.  Cardiovascular: Regular rate and rhythm, no murmurs / rubs / gallops. No extremity edema. 2+ pedal pulses. No carotid bruits.  Abdomen: no tenderness, no masses palpated. No hepatosplenomegaly. Bowel sounds positive.  Musculoskeletal: no clubbing / cyanosis. No  joint deformity upper and lower extremities. Good ROM, no contractures. Normal muscle tone.  Skin: no rashes, lesions, ulcers. No induration Neurologic: CN 2-12 grossly intact. Sensation intact, DTR normal. Strength 5/5 in all 4.  Psychiatric: Normal judgment and insight. Alert and oriented x 3. Normal mood.     Labs on Admission: I have personally reviewed following labs and imaging studies  CBC: Recent Labs  Lab 06/24/18 1316  WBC 16.9*  NEUTROABS 13.3*  HGB 10.7*  HCT 34.1*  MCV 71.2*  PLT 432*   Basic Metabolic  Panel: Recent Labs  Lab 06/24/18 1316  NA 138  K 3.7  CL 103  CO2 26  GLUCOSE 95  BUN 6  CREATININE 0.67  CALCIUM 9.1   GFR: Estimated Creatinine Clearance: 103.6 mL/min (by C-G formula based on SCr of 0.67 mg/dL). Liver Function Tests: No results for input(s): AST, ALT, ALKPHOS, BILITOT, PROT, ALBUMIN in the last 168 hours. No results for input(s): LIPASE, AMYLASE in the last 168 hours. No results for input(s): AMMONIA in the last 168 hours. Coagulation Profile: No results for input(s): INR, PROTIME in the last 168 hours. Cardiac Enzymes: No results for input(s): CKTOTAL, CKMB, CKMBINDEX, TROPONINI in the last 168 hours. BNP (last 3 results) No results for input(s): PROBNP in the last 8760 hours. HbA1C: No results for input(s): HGBA1C in the last 72 hours. CBG: No results for input(s): GLUCAP in the last 168 hours. Lipid Profile: No results for input(s): CHOL, HDL, LDLCALC, TRIG, CHOLHDL, LDLDIRECT in the last 72 hours. Thyroid Function Tests: No results for input(s): TSH, T4TOTAL, FREET4, T3FREE, THYROIDAB in the last 72 hours. Anemia Panel: No results for input(s): VITAMINB12, FOLATE, FERRITIN, TIBC, IRON, RETICCTPCT in the last 72 hours. Urine analysis:    Component Value Date/Time   COLORURINE YELLOW 02/01/2017 0112   APPEARANCEUR CLEAR 02/01/2017 0112   LABSPEC 1.030 02/01/2017 0112   PHURINE 5.0 02/01/2017 0112   GLUCOSEU NEGATIVE 02/01/2017 0112   HGBUR SMALL (A) 02/01/2017 0112   BILIRUBINUR NEGATIVE 02/01/2017 0112   KETONESUR 5 (A) 02/01/2017 0112   PROTEINUR NEGATIVE 02/01/2017 0112   NITRITE NEGATIVE 02/01/2017 0112   LEUKOCYTESUR TRACE (A) 02/01/2017 0112   Sepsis Labs: @LABRCNTIP (procalcitonin:4,lacticidven:4) )No results found for this or any previous visit (from the past 240 hour(s)).   Radiological Exams on Admission: Dg Ankle Complete Right  Result Date: 06/24/2018 CLINICAL DATA:  Right foot and ankle swelling after suffering a fall 1 week  ago. EXAM: RIGHT ANKLE - COMPLETE 3+ VIEW COMPARISON:  Right foot radiographs-earlier same date FINDINGS: There is diffuse soft tissue swelling about the ankle and hindfoot. No associated fracture or dislocation. Joint spaces are preserved. Ankle mortise is preserved. No definite ankle joint effusion. No radiopaque foreign body. IMPRESSION: Diffuse soft tissue swelling about the ankle and hindfoot without associated fracture, dislocation or radiopaque foreign body. Electronically Signed   By: Simonne Come M.D.   On: 06/24/2018 11:07   Dg Foot Complete Right  Result Date: 06/24/2018 CLINICAL DATA:  Right foot and ankle swelling after suffering a fall 1 week ago. EXAM: RIGHT FOOT COMPLETE - 3+ VIEW COMPARISON:  Right ankle radiographs-earlier same day FINDINGS: There is diffuse soft tissue swelling about the foot and ankle. No associated fracture or radiopaque foreign body. Mild hallux valgus deformity without associated degenerative change. Joint spaces are preserved. No erosions. No radiopaque foreign body. IMPRESSION: Nonspecific diffuse soft tissue swelling about the foot and ankle without associated fracture, dislocation or radiopaque foreign body. Electronically Signed  By: Simonne Come M.D.   On: 06/24/2018 11:08    Assessment/Plan Principal Problem:   Cellulitis and abscess of right leg Active Problems:   Bipolar affective disorder, depressed, mild (HCC)   Leucocytosis   Anemia     #1 cellulitis of the right leg: Patient will be admitted and started on IV Ancef.  Elevate the foot and supportive care.  Pain management.  If and when she is better will be transition to oral antibiotics prior to discharge home.  #2 bipolar disorder: Appears to be stable.  Continue home regimen.  #3 leukocytosis: Secondary to cellulitis.  Monitor her white count.  #4 normocytic anemia: Probably secondary to diet.  Borderline anemia.  Outpatient work-up   DVT prophylaxis: SCD Code Status: Full code Family  Communication: Patient's mother Disposition Plan: Home Consults called: None needed Admission status: Inpatient  Severity of Illness: The appropriate patient status for this patient is INPATIENT. Inpatient status is judged to be reasonable and necessary in order to provide the required intensity of service to ensure the patient's safety. The patient's presenting symptoms, physical exam findings, and initial radiographic and laboratory data in the context of their chronic comorbidities is felt to place them at high risk for further clinical deterioration. Furthermore, it is not anticipated that the patient will be medically stable for discharge from the hospital within 2 midnights of admission. The following factors support the patient status of inpatient.   " The patient's presenting symptoms include swollen right leg. " The worrisome physical exam findings include leg swollen and tender. " The initial radiographic and laboratory data are worrisome because of no evidence of bone involvement. " The chronic co-morbidities include bipolar disorder.   * I certify that at the point of admission it is my clinical judgment that the patient will require inpatient hospital care spanning beyond 2 midnights from the point of admission due to high intensity of service, high risk for further deterioration and high frequency of surveillance required.Lonia Blood MD [facility] Pager [phone]  If 7PM-7AM, please contact night-coverage [email or web address] Password [facility]  06/24/2018, 6:26 PM

## 2018-06-24 NOTE — Progress Notes (Signed)
*  Preliminary Results* Right lower extremity venous duplex completed. Right lower extremity is negative for deep vein thrombosis. There is no evidence of right Baker's cyst.  06/24/2018 2:17 PM  Aundra Millet Clare Gandy

## 2018-06-24 NOTE — ED Provider Notes (Signed)
Medical screening examination/treatment/procedure(s) were conducted as a shared visit with non-physician practitioner(s) and myself.  I personally evaluated the patient during the encounter.  None   Results for orders placed or performed during the hospital encounter of 06/24/18  CBC with Differential  Result Value Ref Range   WBC 16.9 (H) 4.0 - 10.5 K/uL   RBC 4.79 3.87 - 5.11 MIL/uL   Hemoglobin 10.7 (L) 12.0 - 15.0 g/dL   HCT 16.1 (L) 09.6 - 04.5 %   MCV 71.2 (L) 80.0 - 100.0 fL   MCH 22.3 (L) 26.0 - 34.0 pg   MCHC 31.4 30.0 - 36.0 g/dL   RDW 40.9 81.1 - 91.4 %   Platelets 432 (H) 150 - 400 K/uL   nRBC 0.0 0.0 - 0.2 %   Neutrophils Relative % 80 %   Neutro Abs 13.3 (H) 1.7 - 7.7 K/uL   Lymphocytes Relative 12 %   Lymphs Abs 2.1 0.7 - 4.0 K/uL   Monocytes Relative 7 %   Monocytes Absolute 1.2 (H) 0.1 - 1.0 K/uL   Eosinophils Relative 0 %   Eosinophils Absolute 0.0 0.0 - 0.5 K/uL   Basophils Relative 0 %   Basophils Absolute 0.0 0.0 - 0.1 K/uL   Immature Granulocytes 1 %   Abs Immature Granulocytes 0.23 (H) 0.00 - 0.07 K/uL  Basic metabolic panel  Result Value Ref Range   Sodium 138 135 - 145 mmol/L   Potassium 3.7 3.5 - 5.1 mmol/L   Chloride 103 98 - 111 mmol/L   CO2 26 22 - 32 mmol/L   Glucose, Bld 95 70 - 99 mg/dL   BUN 6 6 - 20 mg/dL   Creatinine, Ser 7.82 0.44 - 1.00 mg/dL   Calcium 9.1 8.9 - 95.6 mg/dL   GFR calc non Af Amer >60 >60 mL/min   GFR calc Af Amer >60 >60 mL/min   Anion gap 9 5 - 15   Dg Ankle Complete Right  Result Date: 06/24/2018 CLINICAL DATA:  Right foot and ankle swelling after suffering a fall 1 week ago. EXAM: RIGHT ANKLE - COMPLETE 3+ VIEW COMPARISON:  Right foot radiographs-earlier same date FINDINGS: There is diffuse soft tissue swelling about the ankle and hindfoot. No associated fracture or dislocation. Joint spaces are preserved. Ankle mortise is preserved. No definite ankle joint effusion. No radiopaque foreign body. IMPRESSION: Diffuse  soft tissue swelling about the ankle and hindfoot without associated fracture, dislocation or radiopaque foreign body. Electronically Signed   By: Simonne Come M.D.   On: 06/24/2018 11:07   Dg Foot Complete Right  Result Date: 06/24/2018 CLINICAL DATA:  Right foot and ankle swelling after suffering a fall 1 week ago. EXAM: RIGHT FOOT COMPLETE - 3+ VIEW COMPARISON:  Right ankle radiographs-earlier same day FINDINGS: There is diffuse soft tissue swelling about the foot and ankle. No associated fracture or radiopaque foreign body. Mild hallux valgus deformity without associated degenerative change. Joint spaces are preserved. No erosions. No radiopaque foreign body. IMPRESSION: Nonspecific diffuse soft tissue swelling about the foot and ankle without associated fracture, dislocation or radiopaque foreign body. Electronically Signed   By: Simonne Come M.D.   On: 06/24/2018 11:08    Patient seen by me along with the physician assistant.  Patient presented today for right foot and ankle swelling.  Patient stated that things started about a week ago got worse on Saturday.  Having difficulty ambulating.  Patient has leukocytosis here.  No fever.  Her right leg has significant  swelling and erythema up to about the mid calf area.  Also has some skin breakdown along the great toe.  No obvious fluctuant but there could have been superficial infection there at the time.  X-rays of foot and leg without any bony abnormalities or any soft tissue gas.  Patient needs IV antibiotics for the cellulitis and needs his admission until things improve.  Then can be switched over to oral antibiotics.  Admitting team will be called.  In addition the right leg does have good cap refill.  No swelling at the knee.  Patient nontoxic no acute distress.   Vanetta Mulders, MD 06/24/18 1744

## 2018-06-24 NOTE — ED Triage Notes (Signed)
Pt in c/o right foot and ankle swelling, states she fell about a week ago and has been walking on her leg since, the swelling did not begin until several days after fall, significant swelling to foot and ankle up into her calf

## 2018-06-25 DIAGNOSIS — F3131 Bipolar disorder, current episode depressed, mild: Secondary | ICD-10-CM

## 2018-06-25 DIAGNOSIS — D72829 Elevated white blood cell count, unspecified: Secondary | ICD-10-CM

## 2018-06-25 DIAGNOSIS — D649 Anemia, unspecified: Secondary | ICD-10-CM

## 2018-06-25 LAB — CBC
HEMATOCRIT: 31.1 % — AB (ref 36.0–46.0)
Hemoglobin: 9.7 g/dL — ABNORMAL LOW (ref 12.0–15.0)
MCH: 22.2 pg — ABNORMAL LOW (ref 26.0–34.0)
MCHC: 31.2 g/dL (ref 30.0–36.0)
MCV: 71.3 fL — AB (ref 80.0–100.0)
NRBC: 0 % (ref 0.0–0.2)
Platelets: 395 10*3/uL (ref 150–400)
RBC: 4.36 MIL/uL (ref 3.87–5.11)
RDW: 14.5 % (ref 11.5–15.5)
WBC: 14.3 10*3/uL — ABNORMAL HIGH (ref 4.0–10.5)

## 2018-06-25 LAB — COMPREHENSIVE METABOLIC PANEL
ALBUMIN: 2.6 g/dL — AB (ref 3.5–5.0)
ALT: 10 U/L (ref 0–44)
ANION GAP: 10 (ref 5–15)
AST: 11 U/L — ABNORMAL LOW (ref 15–41)
Alkaline Phosphatase: 50 U/L (ref 38–126)
BUN: 5 mg/dL — ABNORMAL LOW (ref 6–20)
CHLORIDE: 105 mmol/L (ref 98–111)
CO2: 23 mmol/L (ref 22–32)
Calcium: 8.5 mg/dL — ABNORMAL LOW (ref 8.9–10.3)
Creatinine, Ser: 0.6 mg/dL (ref 0.44–1.00)
GFR calc Af Amer: >60 mL/min (ref >60)
GFR calc non Af Amer: >60 mL/min (ref >60)
GLUCOSE: 104 mg/dL — AB (ref 70–99)
Potassium: 3.5 mmol/L (ref 3.5–5.1)
SODIUM: 138 mmol/L (ref 135–145)
Total Bilirubin: 0.4 mg/dL (ref 0.3–1.2)
Total Protein: 6.6 g/dL (ref 6.5–8.1)

## 2018-06-25 LAB — HIV ANTIBODY (ROUTINE TESTING W REFLEX): HIV Screen 4th Generation wRfx: NONREACTIVE

## 2018-06-25 MED ORDER — ACETAMINOPHEN 325 MG PO TABS
650.0000 mg | ORAL_TABLET | Freq: Four times a day (QID) | ORAL | Status: DC | PRN
  Administered 2018-06-25 – 2018-06-27 (×6): 650 mg via ORAL
  Filled 2018-06-25 (×6): qty 2

## 2018-06-25 NOTE — Plan of Care (Signed)
  Problem: Pain Managment: Goal: General experience of comfort will improve Outcome: Progressing   Problem: Safety: Goal: Ability to remain free from injury will improve Outcome: Progressing   

## 2018-06-25 NOTE — Progress Notes (Signed)
Patient ID: [name], female   DOB: [birth year], 22 y.o.   MRN: [id number]  PROGRESS NOTE    Lafaye Mcelmurry  WUJ:[id number] DOB: 1996-08-20 DOA: 06/24/2018 PCP: Patient, No Pcp Per   Brief Narrative:  22 year old female with history of bipolar disorder, marijuana use presented with right leg swelling and pain after she hurt her leg about a week ago.  She was found to have leukocytosis and x-ray of the foot showed soft tissue swelling but no fractures.  She was started on IV antibiotics.   Assessment & Plan:   Principal Problem:   Cellulitis and abscess of right leg Active Problems:   Bipolar affective disorder, depressed, mild (HCC)   Leucocytosis   Anemia  Right lower extremity cellulitis -Started on IV Ancef on admission.  Continue the same.  Not much improved since admission.  Continue leg elevation and supportive care including pain management. -No signs of abscess at this time -Ultrasound duplex was negative for DVT  Leukocytosis -Secondary to above.  Monitor  Bipolar disorder -Stable.  Outpatient follow-up  Microcytic anemia -Hemoglobin stable.  Outpatient follow-up    DVT prophylaxis: SCDs Code Status: Full Family Communication: None at bedside  disposition Plan: Home in 1 to 3 days if clinically improves  Consultants: None  Procedures: Ultrasound of blocks was negative for DVT  Antimicrobials: Ancef from 06/24/2018 onwards   Subjective: Patient seen and examined at bedside.  Still complains of right lower externally swelling and pain, not improved much since admission.  No overnight fever or vomiting.  Objective: Vitals:   06/24/18 1900 06/24/18 1939 06/24/18 2105 06/25/18 0427  BP: (!) 98/58 (!) 103/58 97/82 (!) 97/59  Pulse: 79 80 72 69  Resp:  18 20 18   Temp:   98.3 F (36.8 C) 98.5 F (36.9 C)  TempSrc:   Oral Oral  SpO2: 100% 100% 100% 100%  Weight:   56.4 kg   Height:   5\' 8"  (1.727 m)     Intake/Output Summary (Last 24 hours) at  06/25/2018 1202 Last data filed at 06/25/2018 0900 Gross per 24 hour  Intake 1240 ml  Output -  Net 1240 ml   Filed Weights   06/24/18 1223 06/24/18 2105  Weight: 59 kg 56.4 kg    Examination:  General exam: Appears calm and comfortable  Respiratory system: Bilateral decreased breath sounds at bases Cardiovascular system: S1 & S2 heard, Rate controlled Gastrointestinal system: Abdomen is nondistended, soft and nontender. Normal bowel sounds heard. Extremities: No cyanosis, clubbing; right lower extremity is swollen, tender and warm to touch.  No areas of discharge.   Data Reviewed: I have personally reviewed following labs and imaging studies  CBC: Recent Labs  Lab 06/24/18 1316 06/25/18 0650  WBC 16.9* 14.3*  NEUTROABS 13.3*  --   HGB 10.7* 9.7*  HCT 34.1* 31.1*  MCV 71.2* 71.3*  PLT 432* 395   Basic Metabolic Panel: Recent Labs  Lab 06/24/18 1316 06/25/18 0650  NA 138 138  K 3.7 3.5  CL 103 105  CO2 26 23  GLUCOSE 95 104*  BUN 6 5*  CREATININE 0.67 0.60  CALCIUM 9.1 8.5*   GFR: Estimated Creatinine Clearance: 99 mL/min (by C-G formula based on SCr of 0.6 mg/dL). Liver Function Tests: Recent Labs  Lab 06/25/18 0650  AST 11*  ALT 10  ALKPHOS 50  BILITOT 0.4  PROT 6.6  ALBUMIN 2.6*   No results for input(s): LIPASE, AMYLASE in the last 168 hours. No results for input(s):  AMMONIA in the last 168 hours. Coagulation Profile: No results for input(s): INR, PROTIME in the last 168 hours. Cardiac Enzymes: No results for input(s): CKTOTAL, CKMB, CKMBINDEX, TROPONINI in the last 168 hours. BNP (last 3 results) No results for input(s): PROBNP in the last 8760 hours. HbA1C: No results for input(s): HGBA1C in the last 72 hours. CBG: No results for input(s): GLUCAP in the last 168 hours. Lipid Profile: No results for input(s): CHOL, HDL, LDLCALC, TRIG, CHOLHDL, LDLDIRECT in the last 72 hours. Thyroid Function Tests: No results for input(s): TSH, T4TOTAL,  FREET4, T3FREE, THYROIDAB in the last 72 hours. Anemia Panel: No results for input(s): VITAMINB12, FOLATE, FERRITIN, TIBC, IRON, RETICCTPCT in the last 72 hours. Sepsis Labs: Recent Labs  Lab 06/24/18 1758  LATICACIDVEN 1.00    No results found for this or any previous visit (from the past 240 hour(s)).       Radiology Studies: Dg Ankle Complete Right  Result Date: 06/24/2018 CLINICAL DATA:  Right foot and ankle swelling after suffering a fall 1 week ago. EXAM: RIGHT ANKLE - COMPLETE 3+ VIEW COMPARISON:  Right foot radiographs-earlier same date FINDINGS: There is diffuse soft tissue swelling about the ankle and hindfoot. No associated fracture or dislocation. Joint spaces are preserved. Ankle mortise is preserved. No definite ankle joint effusion. No radiopaque foreign body. IMPRESSION: Diffuse soft tissue swelling about the ankle and hindfoot without associated fracture, dislocation or radiopaque foreign body. Electronically Signed   By: Simonne Come M.D.   On: 06/24/2018 11:07   Dg Foot Complete Right  Result Date: 06/24/2018 CLINICAL DATA:  Right foot and ankle swelling after suffering a fall 1 week ago. EXAM: RIGHT FOOT COMPLETE - 3+ VIEW COMPARISON:  Right ankle radiographs-earlier same day FINDINGS: There is diffuse soft tissue swelling about the foot and ankle. No associated fracture or radiopaque foreign body. Mild hallux valgus deformity without associated degenerative change. Joint spaces are preserved. No erosions. No radiopaque foreign body. IMPRESSION: Nonspecific diffuse soft tissue swelling about the foot and ankle without associated fracture, dislocation or radiopaque foreign body. Electronically Signed   By: Simonne Come M.D.   On: 06/24/2018 11:08        Scheduled Meds: Continuous Infusions: . sodium chloride 100 mL/hr at 06/24/18 2230  .  ceFAZolin (ANCEF) IV 1 g (06/25/18 1002)     LOS: 1 day        Glade Lloyd, MD [facility] Pager  [phone]  If 7PM-7AM, please contact night-coverage [email or web address] Password [email or web address] 06/25/2018, 12:02 PM

## 2018-06-25 NOTE — Plan of Care (Signed)
?  Problem: Education: ?Goal: Knowledge of General Education information will improve ?Description: Including pain rating scale, medication(s)/side effects and non-pharmacologic comfort measures ?Outcome: Progressing ?  ?Problem: Health Behavior/Discharge Planning: ?Goal: Ability to manage health-related needs will improve ?Outcome: Progressing ?  ?Problem: Clinical Measurements: ?Goal: Respiratory complications will improve ?Outcome: Progressing ?  ?Problem: Nutrition: ?Goal: Adequate nutrition will be maintained ?Outcome: Progressing ?  ?Problem: Pain Managment: ?Goal: General experience of comfort will improve ?Outcome: Progressing ?  ?Problem: Safety: ?Goal: Ability to remain free from injury will improve ?Outcome: Progressing ?  ?Problem: Skin Integrity: ?Goal: Risk for impaired skin integrity will decrease ?Outcome: Progressing ?  ?

## 2018-06-26 LAB — CBC WITH DIFFERENTIAL/PLATELET
ABS IMMATURE GRANULOCYTES: 0.17 10*3/uL — AB (ref 0.00–0.07)
BASOS PCT: 0 %
Basophils Absolute: 0 10*3/uL (ref 0.0–0.1)
Eosinophils Absolute: 0.1 10*3/uL (ref 0.0–0.5)
Eosinophils Relative: 1 %
HCT: 32.1 % — ABNORMAL LOW (ref 36.0–46.0)
HEMOGLOBIN: 9.8 g/dL — AB (ref 12.0–15.0)
IMMATURE GRANULOCYTES: 1 %
LYMPHS ABS: 2.7 10*3/uL (ref 0.7–4.0)
Lymphocytes Relative: 21 %
MCH: 22.1 pg — AB (ref 26.0–34.0)
MCHC: 30.5 g/dL (ref 30.0–36.0)
MCV: 72.3 fL — ABNORMAL LOW (ref 80.0–100.0)
MONOS PCT: 7 %
Monocytes Absolute: 1 10*3/uL (ref 0.1–1.0)
NEUTROS ABS: 9.3 10*3/uL — AB (ref 1.7–7.7)
NRBC: 0 % (ref 0.0–0.2)
Neutrophils Relative %: 70 %
PLATELETS: 432 10*3/uL — AB (ref 150–400)
RBC: 4.44 MIL/uL (ref 3.87–5.11)
RDW: 14.4 % (ref 11.5–15.5)
WBC: 13.2 10*3/uL — ABNORMAL HIGH (ref 4.0–10.5)

## 2018-06-26 LAB — C-REACTIVE PROTEIN: CRP: 16.1 mg/dL — ABNORMAL HIGH (ref <1.0)

## 2018-06-26 LAB — BASIC METABOLIC PANEL
ANION GAP: 7 (ref 5–15)
CO2: 26 mmol/L (ref 22–32)
Calcium: 8.8 mg/dL — ABNORMAL LOW (ref 8.9–10.3)
Chloride: 108 mmol/L (ref 98–111)
Creatinine, Ser: 0.67 mg/dL (ref 0.44–1.00)
GFR calc Af Amer: >60 mL/min (ref >60)
GFR calc non Af Amer: >60 mL/min (ref >60)
GLUCOSE: 101 mg/dL — AB (ref 70–99)
POTASSIUM: 3.6 mmol/L (ref 3.5–5.1)
Sodium: 141 mmol/L (ref 135–145)

## 2018-06-26 LAB — MAGNESIUM: Magnesium: 2.1 mg/dL (ref 1.7–2.4)

## 2018-06-26 MED ORDER — OXYCODONE-ACETAMINOPHEN 5-325 MG PO TABS
1.0000 | ORAL_TABLET | Freq: Once | ORAL | Status: AC
  Administered 2018-06-26: 1 via ORAL
  Filled 2018-06-26: qty 1

## 2018-06-26 MED ORDER — OXYCODONE-ACETAMINOPHEN 5-325 MG PO TABS
1.0000 | ORAL_TABLET | Freq: Four times a day (QID) | ORAL | Status: DC | PRN
  Administered 2018-06-26 – 2018-06-28 (×7): 1 via ORAL
  Filled 2018-06-26 (×8): qty 1

## 2018-06-26 NOTE — Progress Notes (Signed)
PROGRESS NOTE  [name] ZOX:[id number] DOB: 09/13/1995 DOA: 06/24/2018 PCP: Patient, No Pcp Per  HPI/Recap of past 71 hours: 22 year old female with history of bipolar disorder, marijuana use presented with right leg swelling and pain after she hurt her leg about a week ago.  She was found to have leukocytosis and x-ray of the foot showed soft tissue swelling but no fractures.  She was started on IV antibiotics.   Patient doing okay.  Some persistent swelling, but overall, starting to able to bear weight more so today.  Assessment/Plan: Principal Problem:   Cellulitis and abscess of right leg Active Problems:   Bipolar affective disorder, depressed, mild (HCC)   Leucocytosis   Anemia Principal Problem:   Cellulitis and abscess of right leg Active Problems:   Bipolar affective disorder, depressed, mild (HCC)   Leucocytosis   Anemia  Right lower extremity cellulitis -Started on IV Ancef on admission.  Swelling minimally improved.  White blood cell count trending downward.   PRN Percocet for pain, Tylenol not providing enough relief -No signs of abscess at this time -Ultrasound duplex was negative for DVT  Leukocytosis -Continues to improve  Bipolar disorder -Stable.  Outpatient follow-up  Microcytic anemia -Hemoglobin stable.  Outpatient follow-up  Code Status: Full code  Family Communication: Spoke with mom by phone  Disposition Plan: Potential discharge tomorrow or Friday once white count normalized   Consultants:  None  Procedures:  Lower extremity Doppler negative for DVT  Antimicrobials:  IV Ancef 10/28-present  DVT prophylaxis: SCDs   Objective: Vitals:   06/26/18 0425 06/26/18 0802  BP: 97/64 95/63  Pulse: (!) 51 64  Resp: 14 14  Temp: 98.4 F (36.9 C) (!) 97.2 F (36.2 C)  SpO2: 100% 100%    Intake/Output Summary (Last 24 hours) at 06/26/2018 1340 Last data filed at 06/26/2018 0400 Gross per 24 hour  Intake 828.11 ml    Output -  Net 828.11 ml   Filed Weights   06/24/18 1223 06/24/18 2105  Weight: 59 kg 56.4 kg   Body mass index is 18.91 kg/m.  Exam:   General: Alert and oriented x3, no acute distress  Cardiovascular: Regular rate and rhythm, S1-S2  Respiratory: Clear to auscultation bilaterally  Abdomen: Soft, nontender, nondistended, positive bowel sounds  Musculoskeletal: Left lower extremity 1-2+ pitting edema from the knee down to involvement of most of foot.  Tender  Skin: Some erythema on left lower extremity, difficult to visualize given darker skin  Psychiatry: Appropriate, no evidence of psychoses   Data Reviewed: CBC: Recent Labs  Lab 06/24/18 1316 06/25/18 0650 06/26/18 0802  WBC 16.9* 14.3* 13.2*  NEUTROABS 13.3*  --  9.3*  HGB 10.7* 9.7* 9.8*  HCT 34.1* 31.1* 32.1*  MCV 71.2* 71.3* 72.3*  PLT 432* 395 432*   Basic Metabolic Panel: Recent Labs  Lab 06/24/18 1316 06/25/18 0650 06/26/18 0802  NA 138 138 141  K 3.7 3.5 3.6  CL 103 105 108  CO2 26 23 26   GLUCOSE 95 104* 101*  BUN 6 5* <5*  CREATININE 0.67 0.60 0.67  CALCIUM 9.1 8.5* 8.8*  MG  --   --  2.1   GFR: Estimated Creatinine Clearance: 99 mL/min (by C-G formula based on SCr of 0.67 mg/dL). Liver Function Tests: Recent Labs  Lab 06/25/18 0650  AST 11*  ALT 10  ALKPHOS 50  BILITOT 0.4  PROT 6.6  ALBUMIN 2.6*   No results for input(s): LIPASE, AMYLASE in the last 168 hours.  No results for input(s): AMMONIA in the last 168 hours. Coagulation Profile: No results for input(s): INR, PROTIME in the last 168 hours. Cardiac Enzymes: No results for input(s): CKTOTAL, CKMB, CKMBINDEX, TROPONINI in the last 168 hours. BNP (last 3 results) No results for input(s): PROBNP in the last 8760 hours. HbA1C: No results for input(s): HGBA1C in the last 72 hours. CBG: No results for input(s): GLUCAP in the last 168 hours. Lipid Profile: No results for input(s): CHOL, HDL, LDLCALC, TRIG, CHOLHDL,  LDLDIRECT in the last 72 hours. Thyroid Function Tests: No results for input(s): TSH, T4TOTAL, FREET4, T3FREE, THYROIDAB in the last 72 hours. Anemia Panel: No results for input(s): VITAMINB12, FOLATE, FERRITIN, TIBC, IRON, RETICCTPCT in the last 72 hours. Urine analysis:    Component Value Date/Time   COLORURINE YELLOW 02/01/2017 0112   APPEARANCEUR CLEAR 02/01/2017 0112   LABSPEC 1.030 02/01/2017 0112   PHURINE 5.0 02/01/2017 0112   GLUCOSEU NEGATIVE 02/01/2017 0112   HGBUR SMALL (A) 02/01/2017 0112   BILIRUBINUR NEGATIVE 02/01/2017 0112   KETONESUR 5 (A) 02/01/2017 0112   PROTEINUR NEGATIVE 02/01/2017 0112   NITRITE NEGATIVE 02/01/2017 0112   LEUKOCYTESUR TRACE (A) 02/01/2017 0112   Sepsis Labs: @LABRCNTIP (procalcitonin:4,lacticidven:4)  ) Recent Results (from the past 240 hour(s))  Blood culture (routine x 2)     Status: None (Preliminary result)   Collection Time: 06/24/18  5:35 PM  Result Value Ref Range Status   Specimen Description BLOOD RIGHT ANTECUBITAL  Final   Special Requests   Final    BOTTLES DRAWN AEROBIC AND ANAEROBIC Blood Culture adequate volume   Culture   Final    NO GROWTH 2 DAYS Performed at [facility], [address], [place], [place] [address]    Report Status PENDING  Incomplete  Blood culture (routine x 2)     Status: None (Preliminary result)   Collection Time: 06/24/18  5:44 PM  Result Value Ref Range Status   Specimen Description BLOOD LEFT ANTECUBITAL  Final   Special Requests   Final    BOTTLES DRAWN AEROBIC ONLY Blood Culture results may not be optimal due to an inadequate volume of blood received in culture bottles   Culture   Final    NO GROWTH 2 DAYS Performed at [facility], [address], [place], [place] [address]    Report Status PENDING  Incomplete      Studies: No results found.  Scheduled Meds:  Continuous Infusions: .  ceFAZolin (ANCEF) IV 1 g (06/26/18 1008)     LOS: 2 days     Hollice Espy, MD [facility]  To reach me or the doctor on call, go to: [email or web address] Password [email or web address]  06/26/2018, 1:40 PM

## 2018-06-26 NOTE — Plan of Care (Signed)
  Problem: Activity: Goal: Risk for activity intolerance will decrease Outcome: Progressing   Problem: Pain Managment: Goal: General experience of comfort will improve Outcome: Progressing   

## 2018-06-27 DIAGNOSIS — K5900 Constipation, unspecified: Secondary | ICD-10-CM | POA: Clinically undetermined

## 2018-06-27 LAB — CBC
HCT: 30 % — ABNORMAL LOW (ref 36.0–46.0)
Hemoglobin: 9.1 g/dL — ABNORMAL LOW (ref 12.0–15.0)
MCH: 21.7 pg — AB (ref 26.0–34.0)
MCHC: 30.3 g/dL (ref 30.0–36.0)
MCV: 71.6 fL — ABNORMAL LOW (ref 80.0–100.0)
PLATELETS: 513 10*3/uL — AB (ref 150–400)
RBC: 4.19 MIL/uL (ref 3.87–5.11)
RDW: 14.3 % (ref 11.5–15.5)
WBC: 12.3 10*3/uL — AB (ref 4.0–10.5)
nRBC: 0 % (ref 0.0–0.2)

## 2018-06-27 MED ORDER — ENSURE ENLIVE PO LIQD
237.0000 mL | Freq: Two times a day (BID) | ORAL | Status: DC
  Administered 2018-06-27 – 2018-06-28 (×3): 237 mL via ORAL

## 2018-06-27 MED ORDER — AMOXICILLIN-POT CLAVULANATE 875-125 MG PO TABS
1.0000 | ORAL_TABLET | Freq: Two times a day (BID) | ORAL | Status: DC
  Administered 2018-06-27 – 2018-06-28 (×3): 1 via ORAL
  Filled 2018-06-27 (×3): qty 1

## 2018-06-27 MED ORDER — ADULT MULTIVITAMIN W/MINERALS CH
1.0000 | ORAL_TABLET | Freq: Every day | ORAL | Status: DC
  Administered 2018-06-27 – 2018-06-28 (×2): 1 via ORAL
  Filled 2018-06-27 (×2): qty 1

## 2018-06-27 MED ORDER — POLYETHYLENE GLYCOL 3350 17 G PO PACK
17.0000 g | PACK | Freq: Every day | ORAL | Status: DC | PRN
  Administered 2018-06-28: 17 g via ORAL
  Filled 2018-06-27: qty 1

## 2018-06-27 MED ORDER — POLYETHYLENE GLYCOL 3350 17 G PO PACK
17.0000 g | PACK | Freq: Once | ORAL | Status: AC
  Administered 2018-06-27: 17 g via ORAL
  Filled 2018-06-27: qty 1

## 2018-06-27 NOTE — Progress Notes (Signed)
PROGRESS NOTE  [name] ZOX:[id number] DOB: Oct 21, 1995 DOA: 06/24/2018 PCP: Patient, No Pcp Per  HPI/Recap of past 26 hours: 22 year old female with history of bipolar disorder, marijuana use presented with right leg swelling and pain after she hurt her leg about a week ago.  She was found to have leukocytosis and x-ray of the foot showed soft tissue swelling but no fractures.  She was started on IV antibiotics.   Over the past few days, patient has had some improvement.  Still with significant pain, especially when she gets up in the morning and starts having blood flow to her leg.  Able to ambulate with some pain medication.  Also complains of constipation, has not had a bowel movement in several days.  Assessment/Plan: Principal Problem:   Cellulitis and abscess of right leg Active Problems:   Bipolar affective disorder, depressed, mild (HCC)   Leucocytosis   Anemia   Constipation Principal Problem:   Cellulitis and abscess of right leg Active Problems:   Bipolar affective disorder, depressed, mild (HCC)   Leucocytosis   Anemia  Right lower extremity cellulitis -Started on IV Ancef on admission.  Swelling still persisting and fair amount of pain with ambulation.Clif[place]on Asters blood cell count slowly trending downward.  Still not yet normalized.  Unfortunately, she lost her IV access and several attempts have been unsuccessful.  We will attempt to try changing over to p.o. Augmentin.  Recheck CBC in the morning.  Prescription   PRN Percocet for pain, Tylenol not providing enough relief -No signs of abscess at this time -Ultrasound duplex was negative for DVT  Leukocytosis -Continues to improve  Bipolar disorder -Stable.  Outpatient follow-up  Microcytic anemia -Hemoglobin stable.  Outpatient follow-up  Constipation: No bowel movement for several days.  PRN MiraLAX, 1 dose now  Code Status: Full code  Family Communication: Left message for mother  Disposition  Plan: Anticipate discharge tomorrow morning his white blood cell count continues to improve.   Consultants:  None  Procedures:  Lower extremity Doppler negative for DVT  Antimicrobials:  IV Ancef 10/28- 10/31  P.o. Augmentin 10/31-present  DVT prophylaxis: SCDs   Objective: Vitals:   06/27/18 0546 06/27/18 1232  BP: 102/61 103/78  Pulse: (!) 52 66  Resp:  18  Temp: 99.2 F (37.3 C) 98.7 F (37.1 C)  SpO2: 100% 100%    Intake/Output Summary (Last 24 hours) at 06/27/2018 1321 Last data filed at 06/26/2018 1406 Gross per 24 hour  Intake 240 ml  Output -  Net 240 ml   Filed Weights   06/24/18 1223 06/24/18 2105  Weight: 59 kg 56.4 kg   Body mass index is 18.91 kg/m.  Exam:   General: Alert and oriented x3, no acute distress  Cardiovascular: Regular rate and rhythm, S1-S2  Respiratory: Clear to auscultation bilaterally  Abdomen: Soft, nontender, nondistended, positive bowel sounds  Musculoskeletal: Left lower extremity 1+ pitting edema from the knee down to involvement of most of foot.  Tender  Skin: Some erythema on left lower extremity, difficult to visualize given darker skin  Psychiatry: Appropriate, no evidence of psychoses   Data Reviewed: CBC: Recent Labs  Lab 06/24/18 1316 06/25/18 0650 06/26/18 0802 06/27/18 0531  WBC 16.9* 14.3* 13.2* 12.3*  NEUTROABS 13.3*  --  9.3*  --   HGB 10.7* 9.7* 9.8* 9.1*  HCT 34.1* 31.1* 32.1* 30.0*  MCV 71.2* 71.3* 72.3* 71.6*  PLT 432* 395 432* 513*   Basic Metabolic Panel: Recent Labs  Lab  06/24/18 1316 06/25/18 0650 06/26/18 0802  NA 138 138 141  K 3.7 3.5 3.6  CL 103 105 108  CO2 26 23 26   GLUCOSE 95 104* 101*  BUN 6 5* <5*  CREATININE 0.67 0.60 0.67  CALCIUM 9.1 8.5* 8.8*  MG  --   --  2.1   GFR: Estimated Creatinine Clearance: 99 mL/min (by C-G formula based on SCr of 0.67 mg/dL). Liver Function Tests: Recent Labs  Lab 06/25/18 0650  AST 11*  ALT 10  ALKPHOS 50  BILITOT 0.4    PROT 6.6  ALBUMIN 2.6*   No results for input(s): LIPASE, AMYLASE in the last 168 hours. No results for input(s): AMMONIA in the last 168 hours. Coagulation Profile: No results for input(s): INR, PROTIME in the last 168 hours. Cardiac Enzymes: No results for input(s): CKTOTAL, CKMB, CKMBINDEX, TROPONINI in the last 168 hours. BNP (last 3 results) No results for input(s): PROBNP in the last 8760 hours. HbA1C: No results for input(s): HGBA1C in the last 72 hours. CBG: No results for input(s): GLUCAP in the last 168 hours. Lipid Profile: No results for input(s): CHOL, HDL, LDLCALC, TRIG, CHOLHDL, LDLDIRECT in the last 72 hours. Thyroid Function Tests: No results for input(s): TSH, T4TOTAL, FREET4, T3FREE, THYROIDAB in the last 72 hours. Anemia Panel: No results for input(s): VITAMINB12, FOLATE, FERRITIN, TIBC, IRON, RETICCTPCT in the last 72 hours. Urine analysis:    Component Value Date/Time   COLORURINE YELLOW 02/01/2017 0112   APPEARANCEUR CLEAR 02/01/2017 0112   LABSPEC 1.030 02/01/2017 0112   PHURINE 5.0 02/01/2017 0112   GLUCOSEU NEGATIVE 02/01/2017 0112   HGBUR SMALL (A) 02/01/2017 0112   BILIRUBINUR NEGATIVE 02/01/2017 0112   KETONESUR 5 (A) 02/01/2017 0112   PROTEINUR NEGATIVE 02/01/2017 0112   NITRITE NEGATIVE 02/01/2017 0112   LEUKOCYTESUR TRACE (A) 02/01/2017 0112   Sepsis Labs: @LABRCNTIP (procalcitonin:4,lacticidven:4)  ) Recent Results (from the past 240 hour(s))  Blood culture (routine x 2)     Status: None (Preliminary result)   Collection Time: 06/24/18  5:35 PM  Result Value Ref Range Status   Specimen Description BLOOD RIGHT ANTECUBITAL  Final   Special Requests   Final    BOTTLES DRAWN AEROBIC AND ANAEROBIC Blood Culture adequate volume   Culture   Final    NO GROWTH 2 DAYS Performed at [facility], [address] [place], [place] [address]    Report Status PENDING  Incomplete  Blood culture (routine x 2)     Status: None (Preliminary  result)   Collection Time: 06/24/18  5:44 PM  Result Value Ref Range Status   Specimen Description BLOOD LEFT ANTECUBITAL  Final   Special Requests   Final    BOTTLES DRAWN AEROBIC ONLY Blood Culture results may not be optimal due to an inadequate volume of blood received in culture bottles   Culture   Final    NO GROWTH 2 DAYS Performed at [facility], [address] [place], [place] [address]    Report Status PENDING  Incomplete      Studies: No results found.  Scheduled Meds: . amoxicillin-clavulanate  1 tablet Oral Q12H  . polyethylene glycol  17 g Oral Once    Continuous Infusions:    LOS: 3 days     Hollice Espy, MD [facility]  To reach me or the doctor on call, go to: [email or web address] Password [email or web address]  06/27/2018, 1:21 PM

## 2018-06-27 NOTE — Progress Notes (Signed)
Initial Nutrition Assessment  DOCUMENTATION CODES:   Not applicable  INTERVENTION:    Ensure Enlive po BID, each supplement provides 350 kcal and 20 grams of protein  Multivitamin daily  NUTRITION DIAGNOSIS:   Increased nutrient needs related to wound healing as evidenced by estimated needs.  GOAL:   Patient will meet greater than or equal to 90% of their needs  MONITOR:   PO intake, Supplement acceptance, Labs, Skin  REASON FOR ASSESSMENT:   Consult Assessment of nutrition requirement/status  ASSESSMENT:   22 yo female with PMH of bipolar 1 disorder and marijuana use who was admitted on 10/28 with cellulitis and abscess or right leg. Started on IV antibiotics, currently on PO antibiotics.   Patient reports leg pain and headache that comes and goes. RN aware.   Labs and medications reviewed.  Usual weight 133 lbs per patient, down to 124 lbs on admission. 7% weight loss within the past 3-4 months is not significant for the time frame. She says she has been eating poorly for the past few days. Likes Ensure supplements.  NUTRITION - FOCUSED PHYSICAL EXAM:    Most Recent Value  Orbital Region  No depletion  Upper Arm Region  No depletion  Thoracic and Lumbar Region  No depletion  Buccal Region  No depletion  Temple Region  No depletion  Clavicle Bone Region  No depletion  Clavicle and Acromion Bone Region  No depletion  Scapular Bone Region  No depletion  Dorsal Hand  No depletion  Patellar Region  No depletion  Anterior Thigh Region  No depletion  Posterior Calf Region  No depletion  Edema (RD Assessment)  None  Hair  Reviewed  Eyes  Reviewed  Mouth  Reviewed  Skin  Reviewed  Nails  Reviewed       Diet Order:   Diet Order            Diet regular Room service appropriate? Yes; Fluid consistency: Thin  Diet effective now              EDUCATION NEEDS:   No education needs have been identified at this time  Skin:  Skin Assessment: (cellulitis  and abscess to R leg)  Last BM:  10/26  Height:   Ht Readings from Last 1 Encounters:  06/24/18 5\' 8"  (1.727 m)    Weight:   Wt Readings from Last 1 Encounters:  06/24/18 56.4 kg    Ideal Body Weight:  63.6 kg  BMI:  Body mass index is 18.91 kg/m.  Estimated Nutritional Needs:   Kcal:  1750-1950  Protein:  75-90 gm  Fluid:  2 L    Joaquin Courts, RD, LDN, CNSC Pager [phone] After Hours Pager [phone]

## 2018-06-28 DIAGNOSIS — L02415 Cutaneous abscess of right lower limb: Secondary | ICD-10-CM

## 2018-06-28 DIAGNOSIS — L03115 Cellulitis of right lower limb: Principal | ICD-10-CM

## 2018-06-28 LAB — CBC
HEMATOCRIT: 34 % — AB (ref 36.0–46.0)
Hemoglobin: 10.7 g/dL — ABNORMAL LOW (ref 12.0–15.0)
MCH: 22.4 pg — ABNORMAL LOW (ref 26.0–34.0)
MCHC: 31.5 g/dL (ref 30.0–36.0)
MCV: 71.1 fL — AB (ref 80.0–100.0)
Platelets: 606 10*3/uL — ABNORMAL HIGH (ref 150–400)
RBC: 4.78 MIL/uL (ref 3.87–5.11)
RDW: 14.1 % (ref 11.5–15.5)
WBC: 11.3 10*3/uL — AB (ref 4.0–10.5)
nRBC: 0 % (ref 0.0–0.2)

## 2018-06-28 IMAGING — CR DG ABDOMEN 2V
2 series · 2 of 2 positions shown · non-contrast
Comparison: None.

CLINICAL DATA: Mid abdominal pain for 1 week.

EXAM:
ABDOMEN - 2 VIEW

[w abdomen upright]
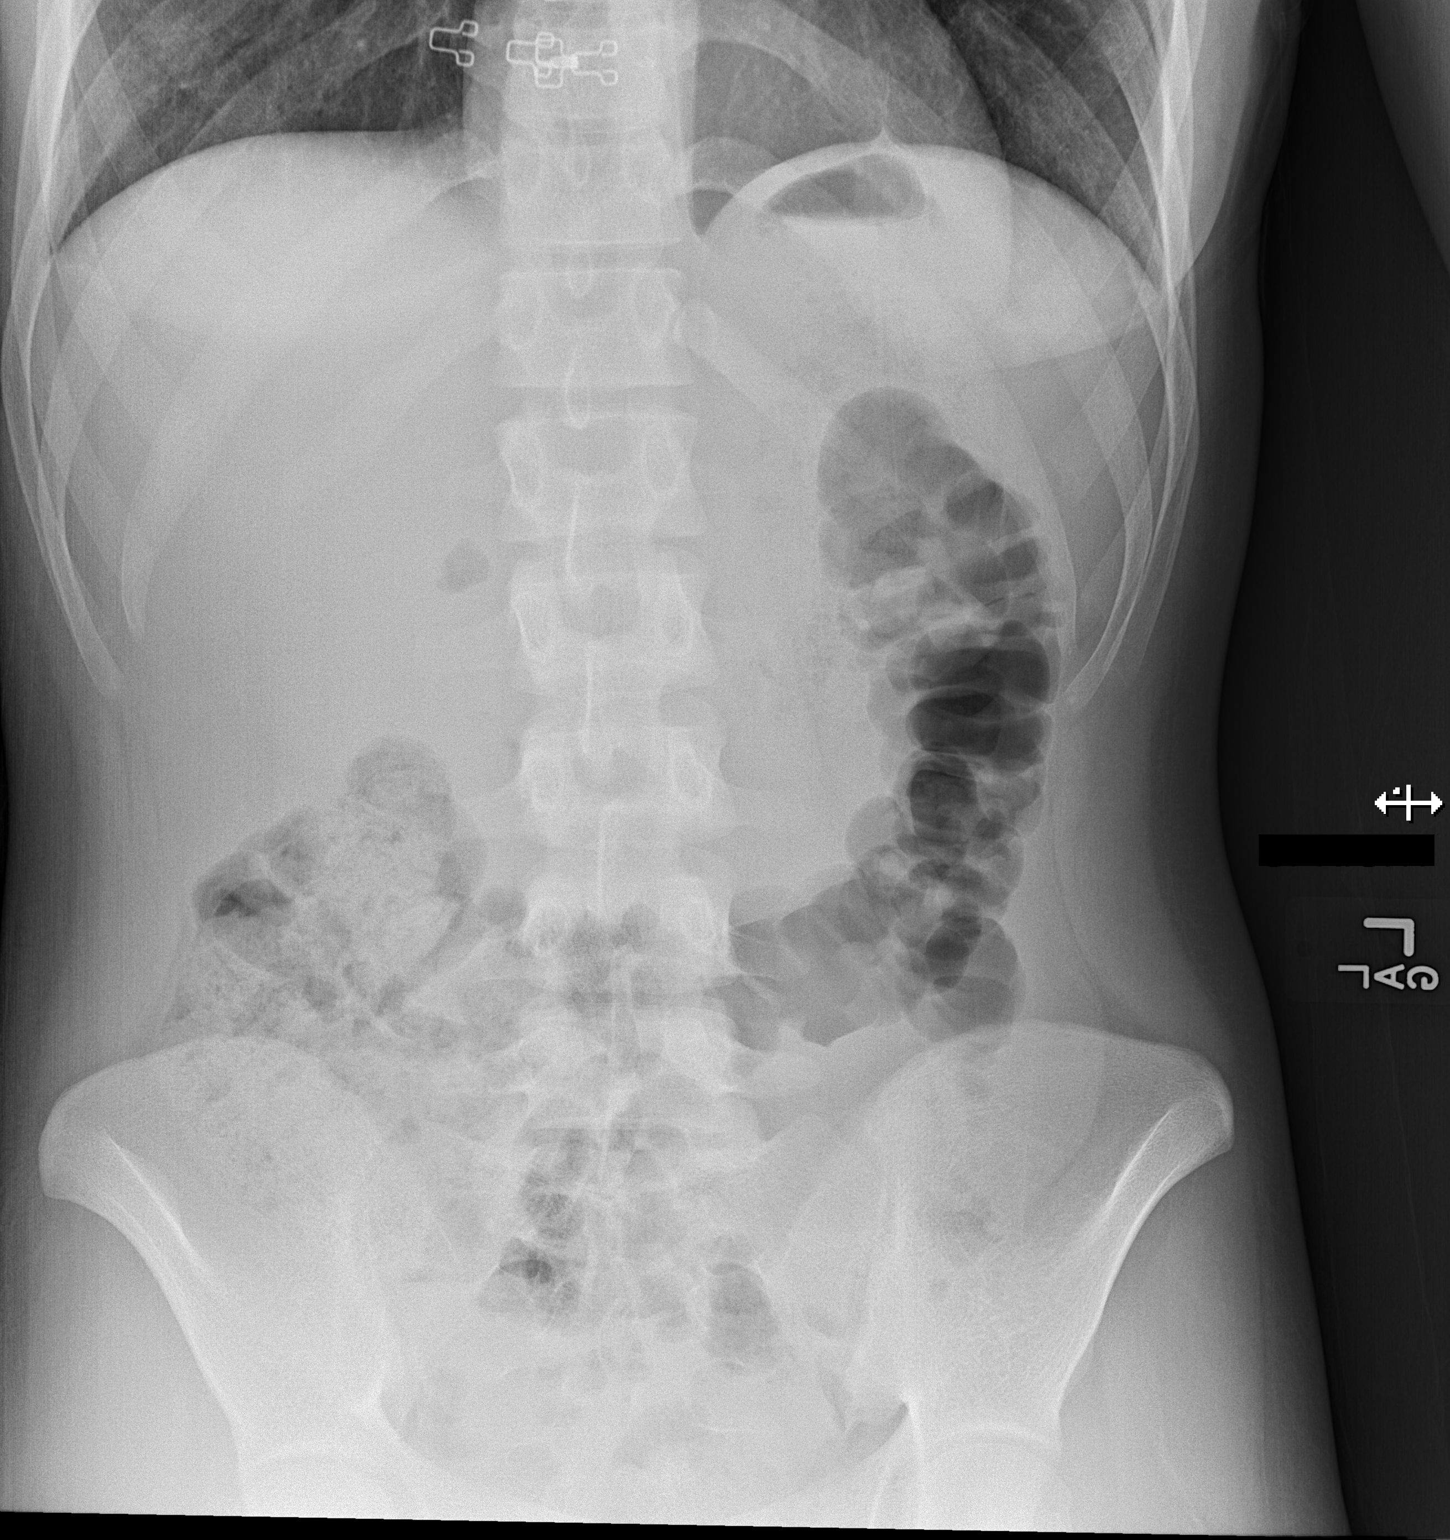

[t abdomen supine]
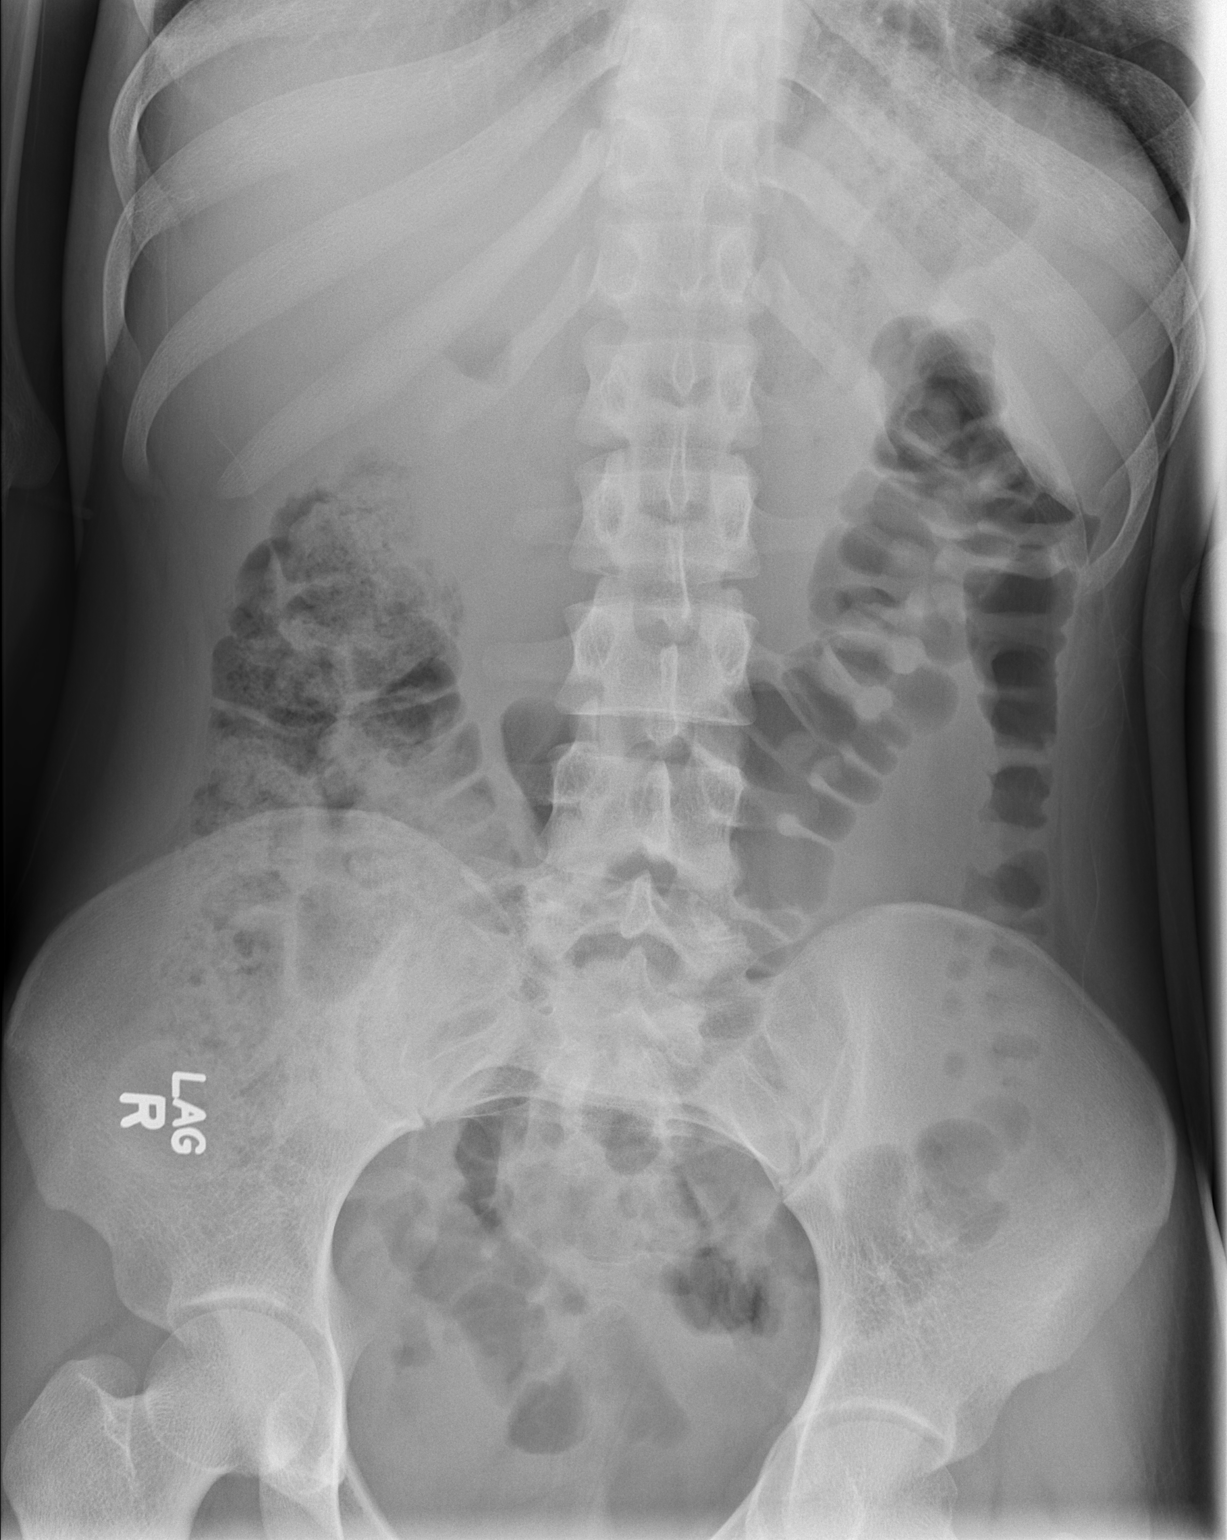

[2 of 2 positions shown; findings below may reference images not displayed]

FINDINGS: No bowel dilatation to suggest obstruction. Moderate stool in the
proximal colon. Air-filled normal caliber distal colon. No free air.
No radiopaque calculi or abnormal soft tissue calcifications. Lower
most lung bases are clear. No osseous abnormality.
IMPRESSION: Negative abdominal radiographs.  Normal bowel gas pattern.

## 2018-06-28 MED ORDER — AMOXICILLIN-POT CLAVULANATE 875-125 MG PO TABS: 1.0000 | ORAL_TABLET | Freq: Two times a day (BID) | ORAL | 0 refills | Status: AC

## 2018-06-28 MED ORDER — LINEZOLID 600 MG PO TABS: 600.0000 mg | ORAL_TABLET | Freq: Two times a day (BID) | ORAL | 0 refills | Status: AC

## 2018-06-28 MED ORDER — ADULT MULTIVITAMIN W/MINERALS CH: 1.0000 | ORAL_TABLET | Freq: Every day | ORAL | 0 refills | Status: AC

## 2018-06-28 MED ORDER — ENSURE ENLIVE PO LIQD: 237.0000 mL | Freq: Two times a day (BID) | ORAL | 12 refills | Status: DC

## 2018-06-28 MED FILL — CERTAVITE-ANTIOXIDANT TAB: 30 days supply | Qty: 30 | Fill #0

## 2018-06-28 MED FILL — AMOX-CLAV 875-125 MG TABLET: 875-125 | 10 days supply | Qty: 20 | Fill #0

## 2018-06-28 MED FILL — LINEZOLID 600 MG TABS: 600 | 10 days supply | Qty: 20 | Fill #0

## 2018-06-28 NOTE — Progress Notes (Signed)
Pt given discharge instructions, crutches and antibiotic medications to take home. All belongings gathered.

## 2018-06-28 NOTE — Care Management Note (Signed)
Case Management Note  Patient Details  Name: [name] MRN: [id number] Date of Birth: 12-21-1995  Subjective/Objective:  22 yr old female admitted with right leg swelling cellulitis. IV antibiotic treatment .                    Action/Plan: patient will receive MATCH and override for copayment for oral antibiotics.     Expected Discharge Date:    06/28/18              Expected Discharge Plan:   Home/self  In-House Referral:  NA  Discharge planning Services  CM Consult, MATCH Program  Post Acute Care Choice:  NA Choice offered to:  NA  DME Arranged:  N/A DME Agency:  NA  HH Arranged:  NA HH Agency:     Status of Service:  In process, will continue to follow  If discussed at Long Length of Stay Meetings, dates discussed:    Additional Comments:  Durenda Guthrie, RN 06/28/2018, 1:28 PM

## 2018-06-28 NOTE — Care Management (Signed)
Was asked to enter [facility] letter and over rides for $3 co pay , for [facility] to fill prescriptions and deliver medications to patient before discharge.   Entered MATCH and co pay over rides , however, prescriptions were sent to [facility]. Text paged MD to see if prescriptions can be sent to [facility].   One prescription is for Zyvox , confirmed with [facility] in [facility] it can be filled under MATCH.   Ronny Flurry RN BSN [phone]

## 2018-06-28 NOTE — Progress Notes (Signed)
Orthopedic Tech Progress Note Patient Details:  [name] Jan 14, 1996 [id number]  Ortho Devices Type of Ortho Device: Crutches Ortho Device/Splint Interventions: Application   Post Interventions Patient Tolerated: Well Instructions Provided: Care of device Viewed order from doctor's order list  Nikki Dom 06/28/2018, 4:36 PM

## 2018-06-28 NOTE — Discharge Summary (Signed)
Physician Discharge Summary  Patient ID: [name] MRN: [id number] DOB/AGE: [birth year] 22 y.o.  Admit date: 06/24/2018 Discharge date: 06/28/2018  Admission Diagnoses:  Discharge Diagnoses:  Principal Problem:   Cellulitis and abscess of right leg Active Problems:   Bipolar affective disorder, depressed, mild (HCC)   Leucocytosis   Anemia   Constipation   Discharged Condition: stable  Hospital Course: Patient is a 22 year old African-American female, with past medical history significant for bipolar disorder and marijuana use.  Patient was admitted with cellulitis of the right lower extremity.  Apparently, patient hurt her leg about a week prior to presentation.  Patient was admitted for further assessment and management.  Leukocytosis was noted on admission.  X-ray of the foot showed soft tissue swelling but no fractures. Patient was started on IV antibiotics (Ancef), and was later changed to oral Augmentin.  Patient's right lower extremity is still swollen.  We will add oral Zyvox.  Will discharge patient home today on oral Augmentin and Zyvox for 10 days.  Patient will follow with the primary care provider within 1 week.  Patient understands that she should contact her primary care provider or come to the emergency room if there is recurrence of, worsening of right lower extremity cellulitis.  Consults: None  Significant Diagnostic Studies: Vascular Doppler ultrasound of the lower extremity was negative for DVT.  Discharge Exam: Blood pressure 117/71, pulse 73, temperature 97.6 F (36.4 C), resp. rate 16, height 5\' 8"  (1.727 m), weight 56.4 kg, SpO2 100 %.   Disposition: Discharge disposition: 01-Home or Self Care   Discharge Instructions    Diet - low sodium heart healthy   Complete by:  As directed    Increase activity slowly   Complete by:  As directed      Allergies as of 06/28/2018   No Known Allergies     Medication List    STOP taking these medications    aspirin-acetaminophen-caffeine 250-250-65 MG tablet Commonly known as:  EXCEDRIN MIGRAINE   naproxen 500 MG tablet Commonly known as:  NAPROSYN     TAKE these medications   amoxicillin-clavulanate 875-125 MG tablet Commonly known as:  AUGMENTIN Take 1 tablet by mouth every 12 (twelve) hours for 10 days.   feeding supplement (ENSURE ENLIVE) Liqd Take 237 mLs by mouth 2 (two) times daily between meals.   linezolid 600 MG tablet Commonly known as:  ZYVOX Take 1 tablet (600 mg total) by mouth 2 (two) times daily for 10 days.   multivitamin with minerals Tabs tablet Take 1 tablet by mouth daily. Start taking on:  06/29/2018      Time spent on discharge: 28 minutes.  SignedBarnetta Chapel 06/28/2018, 1:39 PM

## 2018-06-29 LAB — CULTURE, BLOOD (ROUTINE X 2)
Culture: NO GROWTH
Culture: NO GROWTH
Special Requests: ADEQUATE

## 2018-10-09 ENCOUNTER — Encounter (HOSPITAL_COMMUNITY): Payer: Self-pay | Admitting: Emergency Medicine

## 2018-10-09 ENCOUNTER — Emergency Department (HOSPITAL_COMMUNITY)
Admission: EM | Admit: 2018-10-09 | Discharge: 2018-10-09 | Disposition: A | Discharge status: Home / Self Care | Payer: Medicaid Other | Attending: Emergency Medicine | Admitting: Emergency Medicine

## 2018-10-09 DIAGNOSIS — H608X1 Other otitis externa, right ear: Secondary | ICD-10-CM | POA: Insufficient documentation

## 2018-10-09 DIAGNOSIS — L03115 Cellulitis of right lower limb: Secondary | ICD-10-CM | POA: Insufficient documentation

## 2018-10-09 DIAGNOSIS — H60501 Unspecified acute noninfective otitis externa, right ear: Secondary | ICD-10-CM

## 2018-10-09 DIAGNOSIS — Z79899 Other long term (current) drug therapy: Secondary | ICD-10-CM | POA: Insufficient documentation

## 2018-10-09 DIAGNOSIS — F17210 Nicotine dependence, cigarettes, uncomplicated: Secondary | ICD-10-CM | POA: Insufficient documentation

## 2018-10-09 MED ORDER — OFLOXACIN 0.3 % OT SOLN: 10.0000 [drp] | Freq: Two times a day (BID) | OTIC | 0 refills | Status: DC

## 2018-10-09 MED ORDER — AMOXICILLIN-POT CLAVULANATE 875-125 MG PO TABS: 1.0000 | ORAL_TABLET | Freq: Two times a day (BID) | ORAL | 0 refills | Status: DC

## 2018-10-09 MED ORDER — DOXYCYCLINE HYCLATE 100 MG PO CAPS: 100.0000 mg | ORAL_CAPSULE | Freq: Two times a day (BID) | ORAL | 0 refills | Status: DC

## 2018-10-09 NOTE — ED Triage Notes (Signed)
Pt reports R foot pain and swelling to dorsal area of foot. Pt denies any known trauma or injury.

## 2018-10-09 NOTE — Discharge Instructions (Signed)
Please read attached information. If you experience any new or worsening signs or symptoms please return to the emergency room for evaluation. Please follow-up with your primary care provider or specialist as discussed. Please use medication prescribed only as directed and discontinue taking if you have any concerning signs or symptoms.   °

## 2018-10-09 NOTE — ED Provider Notes (Signed)
[facility] EMERGENCY DEPARTMENT Provider Note   CSN: [id number] Arrival date & time: 10/09/18  0745    History   Chief Complaint Chief Complaint  Patient presents with  . Foot Pain    HPI [name] is a 23 y.o. female.  HPI   23 year old female presents today with swelling in her right lower extremity.  Patient was admitted in October with cellulitis of the right lower extremity.  At that time she was treated as an inpatient and then discharged home on Augmentin and  Linzolid.  She notes that once her symptoms improved she stopped taking the medications.  She notes she continued to have edema in the right lower extremity but no pain or warmth.  She notes that swelling along the dorsal aspect of her foot approximately 1 week ago.  She denies any associated pain or fever.  No trauma.  No history DVT or any other significant risk factors for the same.  She reports she did not follow-up as an outpatient as she did not receive any follow-up information.       Past Medical History:  Diagnosis Date  . Bipolar 1 disorder [facility])     Patient Active Problem List   Diagnosis Date Noted  . Constipation 06/27/2018  . Cellulitis and abscess of right leg 06/24/2018  . Leucocytosis 06/24/2018  . Anemia 06/24/2018  . Bipolar affective disorder, depressed, mild (HCC) 02/09/2017  . Cannabis use disorder, moderate, dependence (HCC) 12/11/2016    History reviewed. No pertinent surgical history.   OB History   No obstetric history on file.      Home Medications    Prior to Admission medications   Medication Sig Start Date End Date Taking? Authorizing Provider  amoxicillin-clavulanate (AUGMENTIN) 875-125 MG tablet Take 1 tablet by mouth every 12 (twelve) hours. 10/09/18   Yalanda Soderman, Tinnie Gens, PA-C  doxycycline (VIBRAMYCIN) 100 MG capsule Take 1 capsule (100 mg total) by mouth 2 (two) times daily. 10/09/18   Sayer Masini, Tinnie Gens, PA-C  feeding supplement, ENSURE ENLIVE,  (ENSURE ENLIVE) LIQD Take 237 mLs by mouth 2 (two) times daily between meals. 06/28/18   Barnetta Chapel, MD  Multiple Vitamin (MULTIVITAMIN WITH MINERALS) TABS tablet Take 1 tablet by mouth daily. 06/29/18   Barnetta Chapel, MD  ofloxacin (FLOXIN) 0.3 % OTIC solution Place 10 drops into the right ear 2 (two) times daily. 10/09/18   Eyvonne Mechanic, PA-C    Family History Family History  Problem Relation Age of Onset  . Drug abuse Mother     Social History Social History   Tobacco Use  . Smoking status: Current Some Day Smoker    Types: Cigars  . Smokeless tobacco: Never Used  Substance Use Topics  . Alcohol use: Yes    Comment: occ  . Drug use: Yes    Types: Marijuana     Allergies   Patient has no known allergies.   Review of Systems Review of Systems  All other systems reviewed and are negative.    Physical Exam Updated Vital Signs BP 128/79   Pulse (!) 58   Temp 98.3 F (36.8 C) (Oral)   Resp 15   SpO2 100%   Physical Exam Vitals signs and nursing note reviewed.  Constitutional:      Appearance: She is well-developed.  HENT:     Head: Normocephalic and atraumatic.     Comments: Right external auditory canal with moderate erythema and discharge, very minimal pain with manipulation of the  ear-TM intact Eyes:     General: No scleral icterus.       Right eye: No discharge.        Left eye: No discharge.     Conjunctiva/sclera: Conjunctivae normal.     Pupils: Pupils are equal, round, and reactive to light.  Neck:     Musculoskeletal: Normal range of motion.     Vascular: No JVD.     Trachea: No tracheal deviation.  Pulmonary:     Effort: Pulmonary effort is normal.     Breath sounds: No stridor.  Musculoskeletal:     Comments: Right lower extremity with minor edema this is nontender throughout, no overlying redness, questionable warmth over the dorsal aspect of the foot  Neurological:     Mental Status: She is alert and oriented to person,  place, and time.     Coordination: Coordination normal.  Psychiatric:        Behavior: Behavior normal.        Thought Content: Thought content normal.        Judgment: Judgment normal.      ED Treatments / Results  Labs (all labs ordered are listed, but only abnormal results are displayed) Labs Reviewed - No data to display  EKG None  Radiology No results found.  Procedures Procedures (including critical care time)  Medications Ordered in ED Medications - No data to display   Initial Impression / Assessment and Plan / ED Course  I have reviewed the triage vital signs and the nursing notes.  Pertinent labs & imaging results that were available during my care of the patient were reviewed by me and considered in my medical decision making (see chart for details).     Labs:   Imaging:  Consults:  Therapeutics:  Discharge Meds: Doxycycline, Augmentin, ofloxacin  Assessment/Plan: 23 year old female presents today with several complaints.  Patient has complaints of pain to the right lower extremity.  She has chronic edema in that leg, with may be early cellulitis developing along the dorsal aspect of the foot.  When patient was discharged from the hospital she was on Linazolid and Augmentin, I will start her back on Augmentin with doxycycline after discussion with the ED pharmacist..  Patient also having what appears to be otitis externa.  She will be placed on ofloxacin for this.  I have instructed the patient to follow-up as an outpatient with [facility].  If she is unable to make follow-up appointment I have instructed her to return to the emergency room before antibiotics are discontinued for recheck of symptoms.      Final Clinical Impressions(s) / ED Diagnoses   Final diagnoses:  Acute otitis externa of right ear, unspecified type  Cellulitis of right lower extremity    ED Discharge Orders         Ordered    ofloxacin (FLOXIN) 0.3 %  OTIC solution  2 times daily     10/09/18 0931    doxycycline (VIBRAMYCIN) 100 MG capsule  2 times daily     10/09/18 0931    amoxicillin-clavulanate (AUGMENTIN) 875-125 MG tablet  Every 12 hours     10/09/18 0931           Eyvonne Mechanic, PA-C 10/09/18 0935    Jacalyn Lefevre, MD 10/09/18 [phone]

## 2019-03-28 ENCOUNTER — Other Ambulatory Visit: Payer: Self-pay

## 2019-03-28 ENCOUNTER — Observation Stay (HOSPITAL_BASED_OUTPATIENT_CLINIC_OR_DEPARTMENT_OTHER): Payer: Self-pay

## 2019-03-28 ENCOUNTER — Observation Stay (HOSPITAL_COMMUNITY): Payer: Self-pay

## 2019-03-28 ENCOUNTER — Inpatient Hospital Stay (HOSPITAL_COMMUNITY)
Admission: EM | Admit: 2019-03-28 | Discharge: 2019-03-30 | DRG: 872 | Disposition: A | Discharge status: Home / Self Care | Payer: Self-pay | Attending: Internal Medicine | Admitting: Internal Medicine

## 2019-03-28 ENCOUNTER — Encounter (HOSPITAL_COMMUNITY): Payer: Self-pay | Admitting: *Deleted

## 2019-03-28 DIAGNOSIS — Z7289 Other problems related to lifestyle: Secondary | ICD-10-CM

## 2019-03-28 DIAGNOSIS — Z79899 Other long term (current) drug therapy: Secondary | ICD-10-CM

## 2019-03-28 DIAGNOSIS — D509 Iron deficiency anemia, unspecified: Secondary | ICD-10-CM

## 2019-03-28 DIAGNOSIS — A419 Sepsis, unspecified organism: Principal | ICD-10-CM | POA: Diagnosis present

## 2019-03-28 DIAGNOSIS — E876 Hypokalemia: Secondary | ICD-10-CM

## 2019-03-28 DIAGNOSIS — F1290 Cannabis use, unspecified, uncomplicated: Secondary | ICD-10-CM | POA: Diagnosis present

## 2019-03-28 DIAGNOSIS — F17290 Nicotine dependence, other tobacco product, uncomplicated: Secondary | ICD-10-CM | POA: Diagnosis present

## 2019-03-28 DIAGNOSIS — Z813 Family history of other psychoactive substance abuse and dependence: Secondary | ICD-10-CM

## 2019-03-28 DIAGNOSIS — R609 Edema, unspecified: Secondary | ICD-10-CM

## 2019-03-28 DIAGNOSIS — M7989 Other specified soft tissue disorders: Secondary | ICD-10-CM

## 2019-03-28 DIAGNOSIS — L0390 Cellulitis, unspecified: Secondary | ICD-10-CM | POA: Diagnosis present

## 2019-03-28 DIAGNOSIS — R52 Pain, unspecified: Secondary | ICD-10-CM

## 2019-03-28 DIAGNOSIS — L03115 Cellulitis of right lower limb: Secondary | ICD-10-CM | POA: Diagnosis present

## 2019-03-28 DIAGNOSIS — F319 Bipolar disorder, unspecified: Secondary | ICD-10-CM | POA: Diagnosis present

## 2019-03-28 DIAGNOSIS — Z20828 Contact with and (suspected) exposure to other viral communicable diseases: Secondary | ICD-10-CM | POA: Diagnosis present

## 2019-03-28 LAB — SARS CORONAVIRUS 2 BY RT PCR (HOSPITAL ORDER, PERFORMED IN ~~LOC~~ HOSPITAL LAB): SARS Coronavirus 2: NEGATIVE

## 2019-03-28 LAB — BASIC METABOLIC PANEL
Anion gap: 8 (ref 5–15)
BUN: 15 mg/dL (ref 6–20)
CO2: 23 mmol/L (ref 22–32)
Calcium: 8.7 mg/dL — ABNORMAL LOW (ref 8.9–10.3)
Chloride: 105 mmol/L (ref 98–111)
Creatinine, Ser: 0.7 mg/dL (ref 0.44–1.00)
GFR calc Af Amer: >60 mL/min (ref >60)
GFR calc non Af Amer: >60 mL/min (ref >60)
Glucose, Bld: 130 mg/dL — ABNORMAL HIGH (ref 70–99)
Potassium: 3.4 mmol/L — ABNORMAL LOW (ref 3.5–5.1)
Sodium: 136 mmol/L (ref 135–145)

## 2019-03-28 LAB — CBC WITH DIFFERENTIAL/PLATELET
Abs Immature Granulocytes: 0.16 10*3/uL — ABNORMAL HIGH (ref 0.00–0.07)
Basophils Absolute: 0 10*3/uL (ref 0.0–0.1)
Basophils Relative: 0 %
Eosinophils Absolute: 0 10*3/uL (ref 0.0–0.5)
Eosinophils Relative: 0 %
HCT: 36.9 % (ref 36.0–46.0)
Hemoglobin: 11.4 g/dL — ABNORMAL LOW (ref 12.0–15.0)
Immature Granulocytes: 1 %
Lymphocytes Relative: 3 %
Lymphs Abs: 0.5 10*3/uL — ABNORMAL LOW (ref 0.7–4.0)
MCH: 23 pg — ABNORMAL LOW (ref 26.0–34.0)
MCHC: 30.9 g/dL (ref 30.0–36.0)
MCV: 74.5 fL — ABNORMAL LOW (ref 80.0–100.0)
Monocytes Absolute: 1 10*3/uL (ref 0.1–1.0)
Monocytes Relative: 6 %
Neutro Abs: 15.7 10*3/uL — ABNORMAL HIGH (ref 1.7–7.7)
Neutrophils Relative %: 90 %
Platelets: 241 10*3/uL (ref 150–400)
RBC: 4.95 MIL/uL (ref 3.87–5.11)
RDW: 14.4 % (ref 11.5–15.5)
WBC: 17.4 10*3/uL — ABNORMAL HIGH (ref 4.0–10.5)
nRBC: 0 % (ref 0.0–0.2)

## 2019-03-28 LAB — RETICULOCYTES
Immature Retic Fract: 8.8 % (ref 2.3–15.9)
RBC.: 4.99 MIL/uL (ref 3.87–5.11)
Retic Count, Absolute: 61.4 10*3/uL (ref 19.0–186.0)
Retic Ct Pct: 1.2 % (ref 0.4–3.1)

## 2019-03-28 LAB — IRON AND TIBC
Iron: 8 ug/dL — ABNORMAL LOW (ref 28–170)
Saturation Ratios: 2 % — ABNORMAL LOW (ref 10.4–31.8)
TIBC: 375 ug/dL (ref 250–450)
UIBC: 367 ug/dL

## 2019-03-28 LAB — LACTIC ACID, PLASMA: Lactic Acid, Venous: 1.8 mmol/L (ref 0.5–1.9)

## 2019-03-28 LAB — SAMPLE TO BLOOD BANK

## 2019-03-28 LAB — FOLATE: Folate: 6.8 ng/mL (ref >5.9)

## 2019-03-28 LAB — VITAMIN B12: Vitamin B-12: 465 pg/mL (ref 180–914)

## 2019-03-28 LAB — FERRITIN: Ferritin: 26 ng/mL (ref 11–307)

## 2019-03-28 LAB — HCG, QUANTITATIVE, PREGNANCY: hCG, Beta Chain, Quant, S: <1 m[IU]/mL (ref <5)

## 2019-03-28 LAB — MAGNESIUM: Magnesium: 1.6 mg/dL — ABNORMAL LOW (ref 1.7–2.4)

## 2019-03-28 MED ORDER — KETOROLAC TROMETHAMINE 15 MG/ML IJ SOLN
15.0000 mg | Freq: Four times a day (QID) | INTRAMUSCULAR | Status: DC | PRN
  Administered 2019-03-28 – 2019-03-29 (×3): 15 mg via INTRAVENOUS
  Filled 2019-03-28 (×3): qty 1

## 2019-03-28 MED ORDER — ACETAMINOPHEN 325 MG PO TABS
650.0000 mg | ORAL_TABLET | Freq: Four times a day (QID) | ORAL | Status: DC | PRN
  Administered 2019-03-28 (×2): 650 mg via ORAL
  Filled 2019-03-28 (×2): qty 2

## 2019-03-28 MED ORDER — SODIUM CHLORIDE 0.9 % IV SOLN
INTRAVENOUS | Status: DC
  Administered 2019-03-28 – 2019-03-30 (×4): via INTRAVENOUS

## 2019-03-28 MED ORDER — MORPHINE SULFATE (PF) 4 MG/ML IV SOLN
4.0000 mg | Freq: Once | INTRAVENOUS | Status: AC
  Administered 2019-03-28: 4 mg via INTRAVENOUS
  Filled 2019-03-28: qty 1

## 2019-03-28 MED ORDER — ONDANSETRON HCL 4 MG/2ML IJ SOLN
4.0000 mg | Freq: Once | INTRAMUSCULAR | Status: AC
  Administered 2019-03-28: 4 mg via INTRAVENOUS
  Filled 2019-03-28: qty 2

## 2019-03-28 MED ORDER — SODIUM CHLORIDE 0.9 % IV BOLUS (SEPSIS): 1000.0000 mL | Freq: Once | INTRAVENOUS | Status: AC

## 2019-03-28 MED ORDER — ACETAMINOPHEN 650 MG RE SUPP: 650.0000 mg | Freq: Four times a day (QID) | RECTAL | Status: DC | PRN

## 2019-03-28 MED ORDER — VANCOMYCIN HCL IN DEXTROSE 1-5 GM/200ML-% IV SOLN: 1000.0000 mg | Freq: Once | INTRAVENOUS | Status: DC

## 2019-03-28 MED ORDER — ENOXAPARIN SODIUM 40 MG/0.4ML ~~LOC~~ SOLN
40.0000 mg | SUBCUTANEOUS | Status: DC
  Filled 2019-03-28 (×2): qty 0.4

## 2019-03-28 MED ORDER — SODIUM CHLORIDE 0.9 % IV BOLUS (SEPSIS)
1000.0000 mL | Freq: Once | INTRAVENOUS | Status: AC
  Administered 2019-03-28: 1000 mL via INTRAVENOUS

## 2019-03-28 MED ORDER — ONDANSETRON HCL 4 MG/2ML IJ SOLN: 4.0000 mg | Freq: Four times a day (QID) | INTRAMUSCULAR | Status: DC | PRN

## 2019-03-28 MED ORDER — POTASSIUM CHLORIDE CRYS ER 20 MEQ PO TBCR
40.0000 meq | EXTENDED_RELEASE_TABLET | Freq: Once | ORAL | Status: AC
  Administered 2019-03-28: 40 meq via ORAL
  Filled 2019-03-28: qty 2

## 2019-03-28 MED ORDER — VANCOMYCIN HCL 10 G IV SOLR
1250.0000 mg | Freq: Two times a day (BID) | INTRAVENOUS | Status: DC
  Administered 2019-03-28 – 2019-03-30 (×5): 1250 mg via INTRAVENOUS
  Filled 2019-03-28 (×5): qty 1250

## 2019-03-28 MED ORDER — ADULT MULTIVITAMIN W/MINERALS CH
1.0000 | ORAL_TABLET | Freq: Every day | ORAL | Status: DC
  Administered 2019-03-28 – 2019-03-30 (×3): 1 via ORAL
  Filled 2019-03-28 (×3): qty 1

## 2019-03-28 MED ORDER — TRAMADOL HCL 50 MG PO TABS
50.0000 mg | ORAL_TABLET | Freq: Four times a day (QID) | ORAL | Status: DC | PRN
  Administered 2019-03-28 – 2019-03-29 (×3): 50 mg via ORAL
  Filled 2019-03-28 (×3): qty 1

## 2019-03-28 MED ORDER — SODIUM CHLORIDE 0.9 % IV SOLN
1.0000 g | Freq: Once | INTRAVENOUS | Status: AC
  Administered 2019-03-28: 1 g via INTRAVENOUS
  Filled 2019-03-28: qty 10

## 2019-03-28 MED ORDER — MAGNESIUM SULFATE 2 GM/50ML IV SOLN
2.0000 g | Freq: Once | INTRAVENOUS | Status: AC
  Administered 2019-03-28: 2 g via INTRAVENOUS
  Filled 2019-03-28: qty 50

## 2019-03-28 MED ORDER — FENTANYL CITRATE (PF) 100 MCG/2ML IJ SOLN
50.0000 ug | Freq: Once | INTRAMUSCULAR | Status: AC
  Administered 2019-03-28: 50 ug via INTRAVENOUS
  Filled 2019-03-28: qty 2

## 2019-03-28 MED ORDER — ONDANSETRON HCL 4 MG PO TABS: 4.0000 mg | ORAL_TABLET | Freq: Four times a day (QID) | ORAL | Status: DC | PRN

## 2019-03-28 MED ORDER — DOCUSATE SODIUM 100 MG PO CAPS
100.0000 mg | ORAL_CAPSULE | Freq: Two times a day (BID) | ORAL | Status: DC
  Administered 2019-03-28 – 2019-03-30 (×5): 100 mg via ORAL
  Filled 2019-03-28 (×5): qty 1

## 2019-03-28 MED ORDER — VANCOMYCIN HCL IN DEXTROSE 1-5 GM/200ML-% IV SOLN
1000.0000 mg | Freq: Once | INTRAVENOUS | Status: AC
  Administered 2019-03-28: 1000 mg via INTRAVENOUS
  Filled 2019-03-28: qty 200

## 2019-03-28 MED ORDER — MORPHINE SULFATE (PF) 4 MG/ML IV SOLN: 4.0000 mg | Freq: Once | INTRAVENOUS | Status: DC

## 2019-03-28 NOTE — Progress Notes (Signed)
Pharmacy Antibiotic Note  [name] is a 23 y.o. female admitted on 03/28/2019 with right lower extremity cellulitis.  Pharmacy has been consulted for Vancomycin dosing. Patient received Vancomycin 1g IV and Ceftriaxone 1g IV in the ED.   Plan: Vancomycin 1250mg  IV q12h Monitor renal function, cultures, clinical course  Height: 5\' 8"  (172.7 cm) Weight: 148 lb (67.1 kg) IBW/kg (Calculated) : 63.9  Temp (24hrs), Avg:98.7 F (37.1 C), Min:98.3 F (36.8 C), Max:99.1 F (37.3 C)  Recent Labs  Lab 03/28/19 0238 03/28/19 0300  WBC 17.4*  --   CREATININE 0.70  --   LATICACIDVEN  --  1.8    Estimated Creatinine Clearance: 111.3 mL/min (by C-G formula based on SCr of 0.7 mg/dL).    No Known Allergies    Thank you for allowing pharmacy to be a part of this patient's care.   Lindell Spar, PharmD, BCPS Clinical Pharmacist  03/28/2019 8:05 AM

## 2019-03-28 NOTE — ED Notes (Signed)
ED TO INPATIENT HANDOFF REPORT  Name/Age/Gender [name] 23 y.o. female  Code Status Code Status History    Date Active Date Inactive Code Status Order ID Comments User Context   06/24/2018 2129 06/28/2018 2010 Full Code [id number]  Rometta EmeryGarba, Mohammad L, MD Inpatient   07/16/2017 0453 07/16/2017 1625 Full Code [id number]  Ward, Chase PicketJaime Pilcher, PA-C ED   02/26/2017 0008 02/26/2017 1603 Full Code [id number]  Arthor CaptainHarris, Abigail, PA-C ED   12/09/2016 1750 12/14/2016 1941 Full Code [id number]  Kristeen MansHobson, Fran E, NP Inpatient   12/09/2016 1750 12/09/2016 1750 Full Code [id number]  Kristeen MansHobson, Fran E, NP Inpatient   12/09/2016 0026 12/09/2016 1749 Full Code [id number]  Benjiman CorePickering, Nathan, MD ED   Advance Care Planning Activity      Home/SNF/Other Home  Chief Complaint Leg Pain;Leg Swelling  Level of Care/Admitting Diagnosis ED Disposition    ED Disposition Condition Comment   Admit  [facility] [[id number][facility]  Level of Care: Med-Surg [16]  Covid Evaluation: Asymptomatic Screening Protocol (No Symptoms)  Diagnosis: Cellulitis of right leg [660630][[id number]]  Admitting Physician: Briscoe DeutscherPYD, TIMOTHY S [[id number]]  Attending Physician: Briscoe DeutscherPYD, TIMOTHY S [[id number]]  PT Class (Do Not Modify): Observation [104]  PT Acc Code (Do Not Modify): Observation [10022]       Medical History Past Medical History:  Diagnosis Date  . Bipolar 1 disorder (HCC)     Allergies No Known Allergies  IV Location/Drains/Wounds Patient Lines/Drains/Airways Status   Active Line/Drains/Airways    Name:   Placement date:   Placement time:   Site:   Days:   Peripheral IV 03/28/19 Right Antecubital   03/28/19    0313    Antecubital   less than 1          Labs/Imaging Results for orders placed or performed during the hospital encounter of 03/28/19 (from the past 48 hour(s))  hCG, quantitative, pregnancy     Status: None   Collection Time: 03/28/19  2:30 AM  Result Value Ref Range   hCG, Beta Chain, Quant, S <1  <5 mIU/mL    Comment:          GEST. AGE      CONC.  (mIU/mL)   <=1 WEEK        5 - 50     2 WEEKS       50 - 500     3 WEEKS       100 - 10,000     4 WEEKS     1,000 - 30,000     5 WEEKS     3,500 - 115,000   6-8 WEEKS     12,000 - 270,000    12 WEEKS     15,000 - 220,000        FEMALE AND NON-PREGNANT FEMALE:     LESS THAN 5 mIU/mL Performed at [facility], [address] [place], [place] [address]   CBC with Differential     Status: Abnormal   Collection Time: 03/28/19  2:38 AM  Result Value Ref Range   WBC 17.4 (H) 4.0 - 10.5 K/uL   RBC 4.95 3.87 - 5.11 MIL/uL   Hemoglobin 11.4 (L) 12.0 - 15.0 g/dL   HCT 27.036.9 62.336.0 - 76.246.0 %   MCV 74.5 (L) 80.0 - 100.0 fL   MCH 23.0 (L) 26.0 - 34.0 pg   MCHC 30.9 30.0 - 36.0 g/dL   RDW 83.114.4 51.711.5 - 61.615.5 %   Platelets 241  150 - 400 K/uL    Comment: REPEATED TO VERIFY   nRBC 0.0 0.0 - 0.2 %   Neutrophils Relative % 90 %   Neutro Abs 15.7 (H) 1.7 - 7.7 K/uL   Lymphocytes Relative 3 %   Lymphs Abs 0.5 (L) 0.7 - 4.0 K/uL   Monocytes Relative 6 %   Monocytes Absolute 1.0 0.1 - 1.0 K/uL   Eosinophils Relative 0 %   Eosinophils Absolute 0.0 0.0 - 0.5 K/uL   Basophils Relative 0 %   Basophils Absolute 0.0 0.0 - 0.1 K/uL   Immature Granulocytes 1 %   Abs Immature Granulocytes 0.16 (H) 0.00 - 0.07 K/uL    Comment: Performed at [facility], [place] [address], [place], [place] [address]  Basic metabolic panel     Status: Abnormal   Collection Time: 03/28/19  2:38 AM  Result Value Ref Range   Sodium 136 135 - 145 mmol/L   Potassium 3.4 (L) 3.5 - 5.1 mmol/L   Chloride 105 98 - 111 mmol/L   CO2 23 22 - 32 mmol/L   Glucose, Bld 130 (H) 70 - 99 mg/dL   BUN 15 6 - 20 mg/dL   Creatinine, Ser 0.70 0.44 - 1.00 mg/dL   Calcium 8.7 (L) 8.9 - 10.3 mg/dL   GFR calc non Af Amer >60 >60 mL/min   GFR calc Af Amer >60 >60 mL/min   Anion gap 8 5 - 15    Comment: Performed at [facility], [place]  [address], [place], [place] [address]  Lactic acid, plasma     Status: None   Collection Time: 03/28/19  3:00 AM  Result Value Ref Range   Lactic Acid, Venous 1.8 0.5 - 1.9 mmol/L    Comment: Performed at [facility], [place] [address], [place], [place] [address]   No results found.  Pending Labs Unresulted Labs (From admission, onward)    Start     Ordered   03/28/19 0239  Blood culture (routine x 2)  BLOOD CULTURE X 2,   STAT     03/28/19 0238          Vitals/Pain Today's Vitals   03/28/19 0215 03/28/19 0330 03/28/19 0430 03/28/19 0530  BP:  (!) 101/59 105/64 (!) 98/58  Pulse:  89 88 80  Resp:  19 18 16   Temp:      TempSrc:      SpO2:  95% 98% 98%  Weight: 67.1 kg     Height: 5\' 8"  (1.727 m)     PainSc:        Isolation Precautions No active isolations  Medications Medications  sodium chloride 0.9 % bolus 1,000 mL (has no administration in time range)  vancomycin (VANCOCIN) IVPB 1000 mg/200 mL premix (1,000 mg Intravenous New Bag/Given 03/28/19 0423)  cefTRIAXone (ROCEPHIN) 1 g in sodium chloride 0.9 % 100 mL IVPB (0 g Intravenous Stopped 03/28/19 0415)  sodium chloride 0.9 % bolus 1,000 mL (1,000 mLs Intravenous New Bag/Given 03/28/19 0313)  morphine 4 MG/ML injection 4 mg (4 mg Intravenous Given 03/28/19 0313)  ondansetron (ZOFRAN) injection 4 mg (4 mg Intravenous Given 03/28/19 0313)  fentaNYL (SUBLIMAZE) injection 50 mcg (50 mcg Intravenous Given 03/28/19 0503)    Mobility walks

## 2019-03-28 NOTE — H&P (Signed)
History and Physical  [name] ZOX:[id number] DOB: 09/29/1995 DOA: 03/28/2019  PCP: Patient, No Pcp Per Patient coming from: Home   I have personally briefly reviewed patient's [facility]   Chief Complaint: Right leg redness, pain.   HPI: [name] is a 23 y.o. female past medical history significant for bipolar disorder, marijuana use prior history of cellulitis of the right lower extremity in 2019 who presents complaining of right lower extremity pain and redness that is started the day prior to admission.  Patient progressively getting worse over the course of the day.  She has not been able to put weight on her right leg because of pain.  She is able to move her ankle with minimal pain.  She denies any trauma.  She denies any use of IV drugs.  She is complaining of of mild pain in the right inguinal area.  He has been feeling chills and cold at times.  Evaluation in the ED: Sodium 136, potassium 3.4, BUN 15, creatinine 0.7, lactic acid 1.8, white blood cell 17, hemoglobin 11, platelets 241, ANC 15   Review of Systems: All systems reviewed and apart from history of presenting illness, are negative.  Past Medical History:  Diagnosis Date  . Bipolar 1 disorder (HCC)    History reviewed. No pertinent surgical history. Social History:  reports that she has been smoking cigars. She has never used smokeless tobacco. She reports current alcohol use. She reports current drug use. Drug: Marijuana.   No Known Allergies  Family History  Problem Relation Age of Onset  . Drug abuse Mother     Prior to Admission medications   Medication Sig Start Date End Date Taking? Authorizing Provider  amoxicillin-clavulanate (AUGMENTIN) 875-125 MG tablet Take 1 tablet by mouth every 12 (twelve) hours. Patient not taking: Reported on 03/28/2019 10/09/18   Hedges, Tinnie GensJeffrey, PA-C  doxycycline (VIBRAMYCIN) 100 MG capsule Take 1 capsule (100 mg total) by mouth 2  (two) times daily. Patient not taking: Reported on 03/28/2019 10/09/18   Eyvonne MechanicHedges, Jeffrey, PA-C  feeding supplement, ENSURE ENLIVE, (ENSURE ENLIVE) LIQD Take 237 mLs by mouth 2 (two) times daily between meals. Patient not taking: Reported on 03/28/2019 06/28/18   Berton Mountgbata, Sylvester I, MD  Multiple Vitamin (MULTIVITAMIN WITH MINERALS) TABS tablet Take 1 tablet by mouth daily. Patient not taking: Reported on 03/28/2019 06/29/18   Berton Mountgbata, Sylvester I, MD  ofloxacin (FLOXIN) 0.3 % OTIC solution Place 10 drops into the right ear 2 (two) times daily. Patient not taking: Reported on 03/28/2019 10/09/18   Eyvonne MechanicHedges, Jeffrey, PA-C   Physical Exam: Vitals:   03/28/19 0330 03/28/19 0430 03/28/19 0530 03/28/19 0701  BP: (!) 101/59 105/64 (!) 98/58 100/63  Pulse: 89 88 80 88  Resp: 19 18 16 14   Temp:    99.1 F (37.3 C)  TempSrc:    Oral  SpO2: 95% 98% 98% 100%  Weight:      Height:         General exam: Moderately built and nourished patient, lying comfortably supine on the gurney in no obvious distress.  Head, eyes and ENT: Nontraumatic and normocephalic. Pupils equally reacting to light and accommodation. Oral mucosa moist.  Neck: Supple. No JVD, carotid bruit or thyromegaly.  Lymphatics: No lymphadenopathy.  Respiratory system: Clear to auscultation. No increased work of breathing.  Cardiovascular system: S1 and S2 heard, RRR. No JVD, murmurs, gallops, clicks.   Gastrointestinal system: Abdomen is nondistended, soft and nontender.  Normal bowel sounds heard. No organomegaly or masses appreciated.  Central nervous system: Alert and oriented. No focal neurological deficits.  Extremities: Symmetric 5 x 5 power. Peripheral pulses symmetrically felt.  Redness and swelling of right lower extremity.  She is able to move passively her right ankle with mild pain  Skin: Mild redness of the lower extremity.  Musculoskeletal system: Negative exam.  Psychiatry: Pleasant and cooperative.   Labs on  Admission:  Basic Metabolic Panel: Recent Labs  Lab 03/28/19 0238 03/28/19 0815  NA 136  --   K 3.4*  --   CL 105  --   CO2 23  --   GLUCOSE 130*  --   BUN 15  --   CREATININE 0.70  --   CALCIUM 8.7*  --   MG  --  1.6*   Liver Function Tests: No results for input(s): AST, ALT, ALKPHOS, BILITOT, PROT, ALBUMIN in the last 168 hours. No results for input(s): LIPASE, AMYLASE in the last 168 hours. No results for input(s): AMMONIA in the last 168 hours. CBC: Recent Labs  Lab 03/28/19 0238  WBC 17.4*  NEUTROABS 15.7*  HGB 11.4*  HCT 36.9  MCV 74.5*  PLT 241   Cardiac Enzymes: No results for input(s): CKTOTAL, CKMB, CKMBINDEX, TROPONINI in the last 168 hours.  BNP (last 3 results) No results for input(s): PROBNP in the last 8760 hours. CBG: No results for input(s): GLUCAP in the last 168 hours.  Radiological Exams on Admission: Dg Ankle 2 Views Right  Result Date: 03/28/2019 CLINICAL DATA:  RIGHT ankle swelling. EXAM: RIGHT ANKLE - 2 VIEW COMPARISON:  None. FINDINGS: No bony abnormalities are identified. There is no evidence of fracture, subluxation or dislocation. The joint spaces are unremarkable. Soft tissue swelling is noted. IMPRESSION: Soft tissue swelling without bony or joint abnormality. Electronically Signed   By: Margarette Canada M.D.   On: 03/28/2019 09:38   Vas [place] Lower Extremity Venous (dvt) (only [facility])  Result Date: 03/28/2019  Lower Venous Study Indications: Pain, Swelling, and Edema.  Comparison Study: previous study done 06/24/18 Performing Technologist: Abram Sander RVS  Examination Guidelines: A complete evaluation includes B-mode imaging, spectral Doppler, color Doppler, and power Doppler as needed of all accessible portions of each vessel. Bilateral testing is considered an integral part of a complete examination. Limited examinations for reoccurring indications may be performed as noted.   +---------+---------------+---------+-----------+----------+--------------+ RIGHT    CompressibilityPhasicitySpontaneityPropertiesSummary        +---------+---------------+---------+-----------+----------+--------------+ CFV      Full           Yes      Yes                                 +---------+---------------+---------+-----------+----------+--------------+ SFJ      Full                                                        +---------+---------------+---------+-----------+----------+--------------+ FV Prox  Full                                                        +---------+---------------+---------+-----------+----------+--------------+  FV Mid   Full                                                        +---------+---------------+---------+-----------+----------+--------------+ FV DistalFull                                                        +---------+---------------+---------+-----------+----------+--------------+ PFV      Full                                                        +---------+---------------+---------+-----------+----------+--------------+ POP      Full           Yes      Yes                                 +---------+---------------+---------+-----------+----------+--------------+ PTV      Full                                                        +---------+---------------+---------+-----------+----------+--------------+ PERO                                                  Not visualized +---------+---------------+---------+-----------+----------+--------------+     Summary: Right: There is no evidence of deep vein thrombosis in the lower extremity.  *See table(s) above for measurements and observations.    Preliminary     EKG: Independently reviewed.  Sinus rhythm  Assessment/Plan Active Problems:   Cellulitis of right leg   Cellulitis   Hypokalemia   Hypomagnesemia   Iron deficiency anemia   1-Right lower extremity cellulitis: Patient presents with redness, pain, tachycardia, leukocytosis. She will be admitted to the hospital for IV antibiotics and IV fluids, due to  rapidly progressively symptoms Will continue with IV vancomycin. Dopplers of lower extremity has been ordered Advised patient to keep leg elevated. Check ankle  x-ray.  2-Hypokalemia: Replete orally. Check mag level. The same IV.  3-Anemia: Check anemia panel. Anemia panel consistent with iron deficiency anemia.  She will need iron tablet at discharge.     DVT Prophylaxis: Lovenox Code Status: Full code Family Communication: Care discussed with patient Disposition Plan: Mid under observation for IV antibiotics, Dopplers of lower extremity.  Time spent: 75 minutes.   Alba CoryBelkys A Montray Kliebert MD [facility]   03/28/2019, 10:47 AM

## 2019-03-28 NOTE — ED Provider Notes (Signed)
TIME SEEN: 2:37 AM  CHIEF COMPLAINT: Right leg pain  HPI: Patient is a 23 year old female with history of bipolar disorder who presents to the emerge12ncy department with 1 day of right leg pain, redness and warmth.  Has a hard time moving the leg or walking on the leg secondary to pain.  No injury.  No fever but has had chills.  No nausea or vomiting.  Not a diabetic or IV drug abuser.  Has been admitted to the hospital before for cellulitis to this lower extremity.  Has had 2 negative venous Dopplers of the right lower extremity in 2019 and 2018.  No history of PE or DVT.  She denies chest pain or shortness of breath.  ROS: See HPI Constitutional: no fever  Eyes: no drainage  ENT: no runny nose   Cardiovascular:  no chest pain  Resp: no SOB  GI: no vomiting GU: no dysuria Integumentary: no rash  Allergy: no hives  Musculoskeletal: Right leg pain Neurological: no slurred speech ROS otherwise negative  PAST MEDICAL HISTORY/PAST SURGICAL HISTORY:  Past Medical History:  Diagnosis Date  . Bipolar 1 disorder (HCC)     MEDICATIONS:  Prior to Admission medications   Medication Sig Start Date End Date Taking? Authorizing Provider  amoxicillin-clavulanate (AUGMENTIN) 875-125 MG tablet Take 1 tablet by mouth every 12 (twelve) hours. 10/09/18   Hedges, Tinnie GensJeffrey, PA-C  doxycycline (VIBRAMYCIN) 100 MG capsule Take 1 capsule (100 mg total) by mouth 2 (two) times daily. 10/09/18   Hedges, Tinnie GensJeffrey, PA-C  feeding supplement, ENSURE ENLIVE, (ENSURE ENLIVE) LIQD Take 237 mLs by mouth 2 (two) times daily between meals. 06/28/18   Barnetta Chapelgbata, Sylvester I, MD  Multiple Vitamin (MULTIVITAMIN WITH MINERALS) TABS tablet Take 1 tablet by mouth daily. 06/29/18   Barnetta Chapelgbata, Sylvester I, MD  ofloxacin (FLOXIN) 0.3 % OTIC solution Place 10 drops into the right ear 2 (two) times daily. 10/09/18   Eyvonne MechanicHedges, Jeffrey, PA-C    ALLERGIES:  No Known Allergies  SOCIAL HISTORY:  Social History   Tobacco Use  . Smoking status:  Current Some Day Smoker    Types: Cigars  . Smokeless tobacco: Never Used  Substance Use Topics  . Alcohol use: Yes    Comment: occ    FAMILY HISTORY: Family History  Problem Relation Age of Onset  . Drug abuse Mother     EXAM: BP 121/74 (BP Location: Left Arm)   Pulse (!) 114   Temp 98.3 F (36.8 C) (Oral)   Resp 18   Ht 5\' 8"  (1.727 m)   Wt 67.1 kg   SpO2 98%   BMI 22.50 kg/m  CONSTITUTIONAL: Alert and oriented and responds appropriately to questions.  Patient appears uncomfortable, tearful HEAD: Normocephalic EYES: Conjunctivae clear, pupils appear equal, EOMI ENT: normal nose; moist mucous membranes NECK: Supple, no meningismus, no nuchal rigidity, no LAD  CARD: Regular and tachycardic; S1 and S2 appreciated; no murmurs, no clicks, no rubs, no gallops RESP: Normal chest excursion without splinting or tachypnea; breath sounds clear and equal bilaterally; no wheezes, no rhonchi, no rales, no hypoxia or respiratory distress, speaking full sentences ABD/GI: Normal bowel sounds; non-distended; soft, non-tender, no rebound, no guarding, no peritoneal signs, no hepatosplenomegaly BACK:  The back appears normal and is non-tender to palpation, there is no CVA tenderness EXT: Redness and warmth to the distal right lower extremity involving the shin and calf, 2+ right DP pulse, no calf swelling noted compared to the left, sensation to light touch intact diffusely,  pain with any minimal movement of the right lower extremity, no bony deformity, no ecchymosis, compartments in the right leg are soft, reports normal sensation in the right leg SKIN: Normal color for age and race; warm; no rash NEURO: Moves all extremities equally PSYCH: The patient's mood and manner are appropriate. Grooming and personal hygiene are appropriate.  MEDICAL DECISION MAKING: Patient here with recurrent cellulitis to the right lower extremity.  She does have calf tenderness on exam but no swelling.  No history  of PE or DVT but I feel she will need a repeat venous Doppler.  She is not having chest pain or shortness of breath.  She is not a diabetic, immunocompromise or IV drug abuser.  Will obtain labs, cultures.  Will give vancomycin and ceftriaxone for broad coverage.  Will give pain medication.  No history of injury to suggest fracture.  Doubt gout, septic arthritis, arterial obstruction, compartment syndrome.  ED PROGRESS: Patient's heart rate has improved.  Blood pressure has dropped slightly.  Will give second bolus of IV fluid.  On reevaluation the redness now goes just below her right knee.  She has a leukocytosis of 17,000 here but normal lactate.  Given how quickly this is spreading I have recommended admission to the hospital.  She is comfortable with this plan.  Will order venous Doppler as well.   5:08 AM Discussed patient's case with hospitalist, Dr. Myna Hidalgo.  I have recommended admission and patient (and family if present) agree with this plan. Admitting physician will place admission orders.   I reviewed all nursing notes, vitals, pertinent previous records, EKGs, lab and urine results, imaging (as available).    EKG Interpretation  Date/Time:  Friday March 28 2019 03:30:25 EDT Ventricular Rate:  87 PR Interval:    QRS Duration: 93 QT Interval:  365 QTC Calculation: 440 R Axis:   -28 Text Interpretation:  Sinus rhythm Probable left atrial enlargement Borderline left axis deviation RSR' in V1 or V2, probably normal variant Borderline T abnormalities, anterior leads No significant change since last tracing Confirmed by Jhordyn Hoopingarner, Cyril Mourning [phone]) on 03/28/2019 3:32:16 AM         Hajar Penninger, Delice Bison, DO 03/28/19 [id number]

## 2019-03-28 NOTE — Progress Notes (Signed)
Lower extremity venous has been completed.   Preliminary results in CV Proc.   Abram Sander 03/28/2019 9:12 AM

## 2019-03-28 NOTE — ED Triage Notes (Signed)
Per EMS - Pt complains of right leg swelling and pain without injury, x 1 day. Started from the knee and moved down. Unable to ambulate. Strong pedal pulse, but skin warm to touch. Pt states this is a recurring problem.    98.1 116 62 100 HR 18 R 98%

## 2019-03-29 DIAGNOSIS — E876 Hypokalemia: Secondary | ICD-10-CM

## 2019-03-29 LAB — CBC
HCT: 31 % — ABNORMAL LOW (ref 36.0–46.0)
Hemoglobin: 9.8 g/dL — ABNORMAL LOW (ref 12.0–15.0)
MCH: 23.6 pg — ABNORMAL LOW (ref 26.0–34.0)
MCHC: 31.6 g/dL (ref 30.0–36.0)
MCV: 74.5 fL — ABNORMAL LOW (ref 80.0–100.0)
Platelets: 184 10*3/uL (ref 150–400)
RBC: 4.16 MIL/uL (ref 3.87–5.11)
RDW: 14.6 % (ref 11.5–15.5)
WBC: 13 10*3/uL — ABNORMAL HIGH (ref 4.0–10.5)
nRBC: 0 % (ref 0.0–0.2)

## 2019-03-29 LAB — BASIC METABOLIC PANEL
Anion gap: 6 (ref 5–15)
BUN: 7 mg/dL (ref 6–20)
CO2: 24 mmol/L (ref 22–32)
Calcium: 8.3 mg/dL — ABNORMAL LOW (ref 8.9–10.3)
Chloride: 107 mmol/L (ref 98–111)
Creatinine, Ser: 0.67 mg/dL (ref 0.44–1.00)
GFR calc Af Amer: >60 mL/min (ref >60)
GFR calc non Af Amer: >60 mL/min (ref >60)
Glucose, Bld: 93 mg/dL (ref 70–99)
Potassium: 3.9 mmol/L (ref 3.5–5.1)
Sodium: 137 mmol/L (ref 135–145)

## 2019-03-29 NOTE — Plan of Care (Signed)
progressing 

## 2019-03-29 NOTE — Progress Notes (Signed)
[facility]                                                                              Patient Demographics  [name], is a 23 y.o. female, DOB - 07-Feb-1996, EYC:[id number]  Admit date - 03/28/2019   Admitting Physician Vianne Bulls, MD  Outpatient Primary MD for the patient is Patient, No Pcp Per  Outpatient specialists:   LOS - 0  days   Medical records reviewed and are as summarized below:    Chief Complaint  Patient presents with  . Leg Swelling       Brief summary   Patient is a 23 year old female with history of bipolar disorder, marijuana use, prior history of right lower extremity cellulitis in 2019 presented with right lower extremity pain, redness that started a day before the admission.  Patient reported progressively getting worse over the course of the day, has not been able to put weight on her right leg because of pain.  Denied any history of IV drugs.  Also complained of mild pain in the right inguinal area.   Assessment & Plan    Principal Problem: Sepsis secondary to cellulitis of right leg -Met sepsis criteria at the time of admission, patient presented with redness, pain in the right lower leg, tachycardia, leukocytosis, hypotension -Cellulitis slightly better today, pain is down, has not ambulated yet -Continue IV vancomycin.  Keep leg elevated -Sepsis physiology improving, continue IV fluid hydration - Doppler ultrasound of the right lower extremity negative for DVT  Active Problems:   Hypokalemia,   Hypomagnesemia -Improved    Iron deficiency anemia H&H currently stable, will place on Niferex upon discharge   Code Status: Full CODE STATUS DVT Prophylaxis: Lovenox Family Communication: Discussed in detail with the patient, all imaging results, lab results explained to the patient    Disposition Plan: Will remain inpatient today, cellulitis not resolved yet, continue IV antibiotics for at least 24 to 48 hours   Time Spent in minutes 25 minutes  Procedures:  None  Consultants:   None  Antimicrobials:   Anti-infectives (From admission, onward)   Start     Dose/Rate Route Frequency Ordered Stop   03/28/19 1200  vancomycin (VANCOCIN) 1,250 mg in sodium chloride 0.9 % 250 mL IVPB     1,250 mg 166.7 mL/hr over 90 Minutes Intravenous Every 12 hours 03/28/19 0805     03/28/19 0800  vancomycin (VANCOCIN) IVPB 1000 mg/200 mL premix  Status:  Discontinued     1,000 mg 200 mL/hr over 60 Minutes Intravenous  Once 03/28/19 0745 03/28/19 0801   03/28/19 0245  vancomycin (VANCOCIN) IVPB 1000 mg/200 mL premix     1,000 mg 200 mL/hr over 60 Minutes Intravenous  Once 03/28/19 0238 03/28/19 0523   03/28/19 0245  cefTRIAXone (ROCEPHIN) 1 g in sodium chloride 0.9 % 100 mL IVPB     1 g 200 mL/hr over 30 Minutes Intravenous  Once 03/28/19 0238 03/28/19 0415         Medications  Scheduled Meds: . docusate sodium  100 mg Oral BID  . enoxaparin (LOVENOX) injection  40 mg Subcutaneous Q24H  .  multivitamin with minerals  1 tablet Oral Daily   Continuous Infusions: . sodium chloride 100 mL/hr at 03/29/19 1013  . vancomycin Stopped (03/29/19 0155)   PRN Meds:.acetaminophen **OR** acetaminophen, ketorolac, ondansetron **OR** ondansetron (ZOFRAN) IV, traMADol      Subjective:   [name] was seen and examined today.  Right leg pain better today however has not ambulated yet, redness receding.  Spiked fever yesterday however no fever this morning.  Patient denies dizziness, chest pain, shortness of breath, abdominal pain, N/V/D/C, new weakness, numbess, tingling. No acute events overnight.    Objective:   Vitals:   03/28/19 0701 03/28/19 1453 03/28/19 2110 03/29/19 0443  BP: 100/63 104/67 112/79 102/66  Pulse: 88 87 67 (!) 59  Resp: '14 18 16 18  ' Temp: 99.1 F (37.3 C) (!) 100.6 F (38.1 C) 98.4 F (36.9 C) 98.3 F (36.8 C)  TempSrc: Oral Oral Oral Oral  SpO2: 100% 100% 100% 100%   Weight:      Height:        Intake/Output Summary (Last 24 hours) at 03/29/2019 1042 Last data filed at 03/29/2019 1000 Gross per 24 hour  Intake 2817.09 ml  Output -  Net 2817.09 ml     Wt Readings from Last 3 Encounters:  03/28/19 67.1 kg  06/24/18 56.4 kg  06/11/18 59 kg     Exam  General: Alert and oriented x 3, NAD  Eyes:   HEENT:  Atraumatic, normocephalic, normal oropharynx  Cardiovascular: S1 S2 auscultated, Regular rate and rhythm.  Respiratory: Clear to auscultation bilaterally, no wheezing, rales or rhonchi  Gastrointestinal: Soft, nontender, nondistended, + bowel sounds  Ext: no pedal edema bilaterally  Neuro: No new deficits  Musculoskeletal: No digital cyanosis, clubbing  Skin: RLE redness with warmth, mild swelling  Psych: Normal affect and demeanor, alert and oriented x3    Data Reviewed:  I have personally reviewed following labs and imaging studies  Micro Results Recent Results (from the past 240 hour(s))  Blood culture (routine x 2)     Status: None (Preliminary result)   Collection Time: 03/28/19  3:00 AM   Specimen: BLOOD  Result Value Ref Range Status   Specimen Description   Final    BLOOD RIGHT ANTECUBITAL Performed at [facility], [address], [place], [place] [address]    Special Requests   Final    BOTTLES DRAWN AEROBIC AND ANAEROBIC Blood Culture adequate volume Performed at [place] [address] [place], [place] [address]    Culture   Final    NO GROWTH 1 DAY Performed at [facility], [place] [address], [place], [place] [address]    Report Status PENDING  Incomplete  Blood culture (routine x 2)     Status: None (Preliminary result)   Collection Time: 03/28/19  3:01 AM   Specimen: BLOOD LEFT FOREARM  Result Value Ref Range Status   Specimen Description   Final    BLOOD LEFT FOREARM Performed at [facility], [place] [address], [place], [place] [address]     Special Requests   Final    BOTTLES DRAWN AEROBIC ONLY Blood Culture adequate volume Performed at [place] [address] [place], [place] [address]    Culture   Final    NO GROWTH 1 DAY Performed at [facility], [place] [address] [place],  [address]    Report Status PENDING  Incomplete  SARS Coronavirus 2 [facility]  order, Performed in [place] hospital lab)     Status: None   Collection Time: 03/28/19  6:52 PM  Result Value Ref Range Status   SARS Coronavirus 2 NEGATIVE NEGATIVE Final    Comment: (NOTE) If result is NEGATIVE SARS-CoV-2 target nucleic acids are NOT DETECTED. The SARS-CoV-2 RNA is generally detectable in upper and lower  respiratory specimens during the acute phase of infection. The lowest  concentration of SARS-CoV-2 viral copies this assay can detect is 250  copies / mL. A negative result does not preclude SARS-CoV-2 infection  and should not be used as the sole basis for treatment or other  patient management decisions.  A negative result may occur with  improper specimen collection / handling, submission of specimen other  than nasopharyngeal swab, presence of viral mutation(s) within the  areas targeted by this assay, and inadequate number of viral copies  (<250 copies / mL). A negative result must be combined with clinical  observations, patient history, and epidemiological information. If result is POSITIVE SARS-CoV-2 target nucleic acids are DETECTED. The SARS-CoV-2 RNA is generally detectable in upper and lower  respiratory specimens dur ing the acute phase of infection.  Positive  results are indicative of active infection with SARS-CoV-2.  Clinical  correlation with patient history and other diagnostic information is  necessary to determine patient infection status.  Positive results do  not rule out bacterial infection or co-infection with other viruses. If result is PRESUMPTIVE POSTIVE SARS-CoV-2 nucleic acids  MAY BE PRESENT.   A presumptive positive result was obtained on the submitted specimen  and confirmed on repeat testing.  While 2019 novel coronavirus  (SARS-CoV-2) nucleic acids may be present in the submitted sample  additional confirmatory testing may be necessary for epidemiological  and / or clinical management purposes  to differentiate between  SARS-CoV-2 and other Sarbecovirus currently known to infect humans.  If clinically indicated additional testing with an alternate test  methodology [phone]) is advised. The SARS-CoV-2 RNA is generally  detectable in upper and lower respiratory sp ecimens during the acute  phase of infection. The expected result is Negative. Fact Sheet for Patients:  StrictlyIdeas.no Fact Sheet for Healthcare Providers: BankingDealers.co.za This test is not yet approved or cleared by the [place] FDA and has been authorized for detection and/or diagnosis of SARS-CoV-2 by FDA under an Emergency Use Authorization (EUA).  This EUA will remain in effect (meaning this test can be used) for the duration of the COVID-19 declaration under Section 564(b)(1) of the Act, 21 U.S.C. section 360bbb-3(b)(1), unless the authorization is terminated or revoked sooner. Performed at [facility], [place] [address], [place], [place] [address]     Radiology Reports Dg Ankle 2 Views Right  Result Date: 03/28/2019 CLINICAL DATA:  RIGHT ankle swelling. EXAM: RIGHT ANKLE - 2 VIEW COMPARISON:  None. FINDINGS: No bony abnormalities are identified. There is no evidence of fracture, subluxation or dislocation. The joint spaces are unremarkable. Soft tissue swelling is noted. IMPRESSION: Soft tissue swelling without bony or joint abnormality. Electronically Signed   By: Margarette Canada M.D.   On: 03/28/2019 09:38   Vas [place] Lower Extremity Venous (dvt) (only [facility])  Result Date: 03/28/2019  Lower Venous Study  Indications: Pain, Swelling, and Edema.  Comparison Study: previous study done 06/24/18 Performing Technologist: Abram Sander RVS  Examination Guidelines: A complete evaluation includes B-mode imaging, spectral Doppler, color Doppler, and power Doppler as needed of all accessible portions of each vessel.  Bilateral testing is considered an integral part of a complete examination. Limited examinations for reoccurring indications may be performed as noted.  +---------+---------------+---------+-----------+----------+--------------+ RIGHT    CompressibilityPhasicitySpontaneityPropertiesSummary        +---------+---------------+---------+-----------+----------+--------------+ CFV      Full           Yes      Yes                                 +---------+---------------+---------+-----------+----------+--------------+ SFJ      Full                                                        +---------+---------------+---------+-----------+----------+--------------+ FV Prox  Full                                                        +---------+---------------+---------+-----------+----------+--------------+ FV Mid   Full                                                        +---------+---------------+---------+-----------+----------+--------------+ FV DistalFull                                                        +---------+---------------+---------+-----------+----------+--------------+ PFV      Full                                                        +---------+---------------+---------+-----------+----------+--------------+ POP      Full           Yes      Yes                                 +---------+---------------+---------+-----------+----------+--------------+ PTV      Full                                                        +---------+---------------+---------+-----------+----------+--------------+ PERO                                                   Not visualized +---------+---------------+---------+-----------+----------+--------------+     Summary: Right: There is no evidence of deep vein thrombosis in the lower extremity.  *See table(s) above for measurements and observations. Electronically signed  by Curt Jews MD on 03/28/2019 at 1:51:32 PM.    Final     Lab Data:  CBC: Recent Labs  Lab 03/28/19 0238 03/29/19 0329  WBC 17.4* 13.0*  NEUTROABS 15.7*  --   HGB 11.4* 9.8*  HCT 36.9 31.0*  MCV 74.5* 74.5*  PLT 241 675   Basic Metabolic Panel: Recent Labs  Lab 03/28/19 0238 03/28/19 0815 03/29/19 0329  NA 136  --  137  K 3.4*  --  3.9  CL 105  --  107  CO2 23  --  24  GLUCOSE 130*  --  93  BUN 15  --  7  CREATININE 0.70  --  0.67  CALCIUM 8.7*  --  8.3*  MG  --  1.6*  --    GFR: Estimated Creatinine Clearance: 111.3 mL/min (by C-G formula based on SCr of 0.67 mg/dL). Liver Function Tests: No results for input(s): AST, ALT, ALKPHOS, BILITOT, PROT, ALBUMIN in the last 168 hours. No results for input(s): LIPASE, AMYLASE in the last 168 hours. No results for input(s): AMMONIA in the last 168 hours. Coagulation Profile: No results for input(s): INR, PROTIME in the last 168 hours. Cardiac Enzymes: No results for input(s): CKTOTAL, CKMB, CKMBINDEX, TROPONINI in the last 168 hours. BNP (last 3 results) No results for input(s): PROBNP in the last 8760 hours. HbA1C: No results for input(s): HGBA1C in the last 72 hours. CBG: No results for input(s): GLUCAP in the last 168 hours. Lipid Profile: No results for input(s): CHOL, HDL, LDLCALC, TRIG, CHOLHDL, LDLDIRECT in the last 72 hours. Thyroid Function Tests: No results for input(s): TSH, T4TOTAL, FREET4, T3FREE, THYROIDAB in the last 72 hours. Anemia Panel: Recent Labs    03/28/19 0815  VITAMINB12 465  FOLATE 6.8  FERRITIN 26  TIBC 375  IRON 8*  RETICCTPCT 1.2   Urine analysis:    Component Value Date/Time   COLORURINE YELLOW 02/01/2017 0112    APPEARANCEUR CLEAR 02/01/2017 0112   LABSPEC 1.030 02/01/2017 0112   PHURINE 5.0 02/01/2017 0112   GLUCOSEU NEGATIVE 02/01/2017 0112   HGBUR SMALL (A) 02/01/2017 0112   BILIRUBINUR NEGATIVE 02/01/2017 0112   KETONESUR 5 (A) 02/01/2017 0112   PROTEINUR NEGATIVE 02/01/2017 0112   NITRITE NEGATIVE 02/01/2017 0112   LEUKOCYTESUR TRACE (A) 02/01/2017 0112     Ripudeep Rai M.D. [facility] 03/29/2019, 10:42 AM  Pager: [phone] Between 7am to 7pm - call Pager - [phone]  After 7pm go to [email or web address] - password [email or web address]  Call night coverage person covering after 7pm

## 2019-03-30 LAB — BASIC METABOLIC PANEL
Anion gap: 10 (ref 5–15)
BUN: 6 mg/dL (ref 6–20)
CO2: 24 mmol/L (ref 22–32)
Calcium: 9.2 mg/dL (ref 8.9–10.3)
Chloride: 106 mmol/L (ref 98–111)
Creatinine, Ser: 0.61 mg/dL (ref 0.44–1.00)
GFR calc Af Amer: >60 mL/min (ref >60)
GFR calc non Af Amer: >60 mL/min (ref >60)
Glucose, Bld: 85 mg/dL (ref 70–99)
Potassium: 3.7 mmol/L (ref 3.5–5.1)
Sodium: 140 mmol/L (ref 135–145)

## 2019-03-30 LAB — CBC
HCT: 38.4 % (ref 36.0–46.0)
Hemoglobin: 12 g/dL (ref 12.0–15.0)
MCH: 23.2 pg — ABNORMAL LOW (ref 26.0–34.0)
MCHC: 31.3 g/dL (ref 30.0–36.0)
MCV: 74.3 fL — ABNORMAL LOW (ref 80.0–100.0)
Platelets: 197 10*3/uL (ref 150–400)
RBC: 5.17 MIL/uL — ABNORMAL HIGH (ref 3.87–5.11)
RDW: 14.7 % (ref 11.5–15.5)
WBC: 7.5 10*3/uL (ref 4.0–10.5)
nRBC: 0 % (ref 0.0–0.2)

## 2019-03-30 MED ORDER — POLYSACCHARIDE IRON COMPLEX 150 MG PO CAPS: 150.0000 mg | ORAL_CAPSULE | Freq: Every day | ORAL | 3 refills | Status: AC

## 2019-03-30 MED ORDER — DOXYCYCLINE HYCLATE 100 MG PO CAPS: 100.0000 mg | ORAL_CAPSULE | Freq: Two times a day (BID) | ORAL | 0 refills | Status: AC

## 2019-03-30 MED ORDER — TRAMADOL HCL 50 MG PO TABS: 50.0000 mg | ORAL_TABLET | Freq: Three times a day (TID) | ORAL | 0 refills | Status: DC | PRN

## 2019-03-30 NOTE — Plan of Care (Signed)
  Problem: Education: Goal: Knowledge of General Education information will improve Description: Including pain rating scale, medication(s)/side effects and non-pharmacologic comfort measures Outcome: Progressing   Problem: Clinical Measurements: Goal: Ability to maintain clinical measurements within normal limits will improve Outcome: Progressing Goal: Respiratory complications will improve Outcome: Progressing Goal: Cardiovascular complication will be avoided Outcome: Progressing   Problem: Activity: Goal: Risk for activity intolerance will decrease Outcome: Progressing   Problem: Nutrition: Goal: Adequate nutrition will be maintained Outcome: Progressing   Problem: Coping: Goal: Level of anxiety will decrease Outcome: Progressing   Problem: Safety: Goal: Ability to remain free from injury will improve Outcome: Progressing

## 2019-03-30 NOTE — Progress Notes (Signed)
CM consult received for patient who wants to discuss medicaid and financial assistance. Case management does not handle these matters, financial counselor to address this. CM signing off.

## 2019-03-30 NOTE — Discharge Summary (Addendum)
Physician Discharge Summary   Patient ID: [name] MRN: [id number] DOB/AGE: Aug 03, 1996 23 y.o.  Admit date: 03/28/2019 Discharge date: 03/30/2019  Primary Care Physician:  Patient, No Pcp Per   Recommendations for Outpatient Follow-up:  1. Follow up with PCP in 1-2 weeks  Home Health: None Equipment/Devices:   Discharge Condition: stable CODE STATUS: FULL  Diet recommendation: regular diet    Discharge Diagnoses:    . Sepsis secondary to cellulitis of right leg . hypokalemia  Hypomagnesemia Iron deficiency anemia  Consults:  None     Allergies:  No Known Allergies   DISCHARGE MEDICATIONS: Allergies as of 03/30/2019   No Known Allergies     Medication List    TAKE these medications   doxycycline 100 MG capsule Commonly known as: Vibramycin Take 1 capsule (100 mg total) by mouth 2 (two) times daily for 7 days. Start taking on: March 31, 2019   iron polysaccharides 150 MG capsule Commonly known as: NIFEREX Take 1 capsule (150 mg total) by mouth daily.   multivitamin with minerals Tabs tablet Take 1 tablet by mouth daily.   traMADol 50 MG tablet Commonly known as: ULTRAM Take 1 tablet (50 mg total) by mouth every 8 (eight) hours as needed for moderate pain or severe pain.        Brief H and P: For complete details please refer to admission H and P, but in brief Patient is a 23 year old female with history of bipolar disorder, marijuana use, prior history of right lower extremity cellulitis in 2019 presented with right lower extremity pain, redness that started a day before the admission.  Patient reported progressively getting worse over the course of the day, has not been able to put weight on her right leg because of pain.  Denied any history of IV drugs.  Also complained of mild pain in the right inguinal area.  Hospital Course:  Sepsis secondary to cellulitis of right leg -Met sepsis criteria at the time of admission, patient presented with  redness, pain in the right lower leg, tachycardia, leukocytosis, hypotension -Cellulitis improving, pain and erythema improving.   -Patient was placed on IV vancomycin, will transition to oral doxycycline for another 7 days.   -Patient recommended to keep her leg elevated, has ambulated without much difficulty.   -Doppler ultrasound of the right lower extremity negative for DVT.  DC home after today's dose of IV antibiotics afternoon     Hypokalemia,   Hypomagnesemia -Potassium 3.4 at the time of admission, magnesium 1.6 -Replaced, potassium 3.7 at the time of discharge    Iron deficiency anemia -Anemia profile consistent with iron deficiency anemia, iron saturation ratio 2, ferritin 26, FE 8 -Hemoglobin currently stable 12.0, placed on iron supplement at discharge   Day of Discharge S: Right lower extremity cellulitis significantly improved, wants to go home  BP 123/87 (BP Location: Left Arm)   Pulse 62   Temp 98.7 F (37.1 C) (Oral)   Resp 20   Ht '5\' 8"'  (1.727 m)   Wt 67.1 kg   SpO2 100%   BMI 22.50 kg/m   Physical Exam: General: Alert and awake oriented x3 not in any acute distress. HEENT: anicteric sclera, pupils reactive to light and accommodation CVS: S1-S2 clear no murmur rubs or gallops Chest: clear to auscultation bilaterally, no wheezing rales or rhonchi Abdomen: soft nontender, nondistended, normal bowel sounds Extremities: no cyanosis, clubbing or edema noted bilaterally Neuro: Cranial nerves II-XII intact, no focal neurological deficits   The results  of significant diagnostics from this hospitalization (including imaging, microbiology, ancillary and laboratory) are listed below for reference.      Procedures/Studies:  Dg Ankle 2 Views Right  Result Date: 03/28/2019 CLINICAL DATA:  RIGHT ankle swelling. EXAM: RIGHT ANKLE - 2 VIEW COMPARISON:  None. FINDINGS: No bony abnormalities are identified. There is no evidence of fracture, subluxation or  dislocation. The joint spaces are unremarkable. Soft tissue swelling is noted. IMPRESSION: Soft tissue swelling without bony or joint abnormality. Electronically Signed   By: Margarette Canada M.D.   On: 03/28/2019 09:38   Vas [place] Lower Extremity Venous (dvt) (only [facility])  Result Date: 03/28/2019  Lower Venous Study Indications: Pain, Swelling, and Edema.  Comparison Study: previous study done 06/24/18 Performing Technologist: Abram Sander RVS  Examination Guidelines: A complete evaluation includes B-mode imaging, spectral Doppler, color Doppler, and power Doppler as needed of all accessible portions of each vessel. Bilateral testing is considered an integral part of a complete examination. Limited examinations for reoccurring indications may be performed as noted.  +---------+---------------+---------+-----------+----------+--------------+ RIGHT    CompressibilityPhasicitySpontaneityPropertiesSummary        +---------+---------------+---------+-----------+----------+--------------+ CFV      Full           Yes      Yes                                 +---------+---------------+---------+-----------+----------+--------------+ SFJ      Full                                                        +---------+---------------+---------+-----------+----------+--------------+ FV Prox  Full                                                        +---------+---------------+---------+-----------+----------+--------------+ FV Mid   Full                                                        +---------+---------------+---------+-----------+----------+--------------+ FV DistalFull                                                        +---------+---------------+---------+-----------+----------+--------------+ PFV      Full                                                        +---------+---------------+---------+-----------+----------+--------------+ POP      Full           Yes       Yes                                 +---------+---------------+---------+-----------+----------+--------------+  PTV      Full                                                        +---------+---------------+---------+-----------+----------+--------------+ PERO                                                  Not visualized +---------+---------------+---------+-----------+----------+--------------+     Summary: Right: There is no evidence of deep vein thrombosis in the lower extremity.  *See table(s) above for measurements and observations. Electronically signed by Curt Jews MD on 03/28/2019 at 1:51:32 PM.    Final       LAB RESULTS: Basic Metabolic Panel: Recent Labs  Lab 03/28/19 0815 03/29/19 0329 03/30/19 0815  NA  --  137 140  K  --  3.9 3.7  CL  --  107 106  CO2  --  24 24  GLUCOSE  --  93 85  BUN  --  7 6  CREATININE  --  0.67 0.61  CALCIUM  --  8.3* 9.2  MG 1.6*  --   --    Liver Function Tests: No results for input(s): AST, ALT, ALKPHOS, BILITOT, PROT, ALBUMIN in the last 168 hours. No results for input(s): LIPASE, AMYLASE in the last 168 hours. No results for input(s): AMMONIA in the last 168 hours. CBC: Recent Labs  Lab 03/28/19 0238 03/29/19 0329 03/30/19 0815  WBC 17.4* 13.0* 7.5  NEUTROABS 15.7*  --   --   HGB 11.4* 9.8* 12.0  HCT 36.9 31.0* 38.4  MCV 74.5* 74.5* 74.3*  PLT 241 184 197   Cardiac Enzymes: No results for input(s): CKTOTAL, CKMB, CKMBINDEX, TROPONINI in the last 168 hours. BNP: Invalid input(s): POCBNP CBG: No results for input(s): GLUCAP in the last 168 hours.    Disposition and Follow-up: Discharge Instructions    Diet general   Complete by: As directed    Increase activity slowly   Complete by: As directed        DISPOSITION: [place]. Schedule an appointment as soon as possible for a visit in 2 week(s).   Why: for  follow up Contact information: [address] [place] [address] [phone]           Time coordinating discharge:  25 minutes  Signed:   Estill Cotta M.D. [facility] 03/30/2019, 2:23 PM

## 2019-04-02 LAB — CULTURE, BLOOD (ROUTINE X 2)
Culture: NO GROWTH
Culture: NO GROWTH
Special Requests: ADEQUATE
Special Requests: ADEQUATE

## 2020-01-14 ENCOUNTER — Encounter (HOSPITAL_COMMUNITY): Payer: Self-pay | Admitting: Emergency Medicine

## 2020-01-14 ENCOUNTER — Emergency Department (HOSPITAL_COMMUNITY)
Admission: EM | Admit: 2020-01-14 | Discharge: 2020-01-14 | Disposition: A | Discharge status: Home / Self Care | Payer: Medicaid Other | Attending: Emergency Medicine | Admitting: Emergency Medicine

## 2020-01-14 ENCOUNTER — Other Ambulatory Visit: Payer: Self-pay

## 2020-01-14 DIAGNOSIS — R222 Localized swelling, mass and lump, trunk: Secondary | ICD-10-CM | POA: Insufficient documentation

## 2020-01-14 DIAGNOSIS — Z5321 Procedure and treatment not carried out due to patient leaving prior to being seen by health care provider: Secondary | ICD-10-CM | POA: Insufficient documentation

## 2020-01-14 NOTE — ED Triage Notes (Signed)
Pt reports since she had her belly button piercing she had bump around it. repotrs when she cleans with peroxide she does have drainage from it. Also has an intermittent bump in right groin denies pain unless touch.

## 2020-01-22 ENCOUNTER — Emergency Department (HOSPITAL_BASED_OUTPATIENT_CLINIC_OR_DEPARTMENT_OTHER)
Admit: 2020-01-22 | Discharge: 2020-01-22 | Disposition: A | Discharge status: Home / Self Care | Payer: Medicaid Other | Attending: Emergency Medicine | Admitting: Emergency Medicine

## 2020-01-22 ENCOUNTER — Emergency Department (HOSPITAL_COMMUNITY)
Admission: EM | Admit: 2020-01-22 | Discharge: 2020-01-22 | Disposition: A | Discharge status: Home / Self Care | Payer: Medicaid Other | Attending: Emergency Medicine | Admitting: Emergency Medicine

## 2020-01-22 ENCOUNTER — Encounter (HOSPITAL_COMMUNITY): Payer: Self-pay | Admitting: Emergency Medicine

## 2020-01-22 DIAGNOSIS — F17200 Nicotine dependence, unspecified, uncomplicated: Secondary | ICD-10-CM | POA: Insufficient documentation

## 2020-01-22 DIAGNOSIS — R600 Localized edema: Secondary | ICD-10-CM | POA: Insufficient documentation

## 2020-01-22 DIAGNOSIS — R519 Headache, unspecified: Secondary | ICD-10-CM | POA: Insufficient documentation

## 2020-01-22 DIAGNOSIS — R609 Edema, unspecified: Secondary | ICD-10-CM

## 2020-01-22 LAB — URINALYSIS, ROUTINE W REFLEX MICROSCOPIC
Bilirubin Urine: NEGATIVE
Glucose, UA: NEGATIVE mg/dL
Hgb urine dipstick: NEGATIVE
Ketones, ur: NEGATIVE mg/dL
Leukocytes,Ua: NEGATIVE
Nitrite: NEGATIVE
Protein, ur: NEGATIVE mg/dL
Specific Gravity, Urine: 1.02 (ref 1.005–1.030)
pH: 7 (ref 5.0–8.0)

## 2020-01-22 LAB — CBC
HCT: 36.6 % (ref 36.0–46.0)
Hemoglobin: 11.4 g/dL — ABNORMAL LOW (ref 12.0–15.0)
MCH: 22.7 pg — ABNORMAL LOW (ref 26.0–34.0)
MCHC: 31.1 g/dL (ref 30.0–36.0)
MCV: 72.9 fL — ABNORMAL LOW (ref 80.0–100.0)
Platelets: 281 10*3/uL (ref 150–400)
RBC: 5.02 MIL/uL (ref 3.87–5.11)
RDW: 15.2 % (ref 11.5–15.5)
WBC: 7 10*3/uL (ref 4.0–10.5)
nRBC: 0 % (ref 0.0–0.2)

## 2020-01-22 LAB — BASIC METABOLIC PANEL
Anion gap: 9 (ref 5–15)
BUN: <5 mg/dL — ABNORMAL LOW (ref 6–20)
CO2: 26 mmol/L (ref 22–32)
Calcium: 9 mg/dL (ref 8.9–10.3)
Chloride: 105 mmol/L (ref 98–111)
Creatinine, Ser: 0.63 mg/dL (ref 0.44–1.00)
GFR calc Af Amer: >60 mL/min (ref >60)
GFR calc non Af Amer: >60 mL/min (ref >60)
Glucose, Bld: 95 mg/dL (ref 70–99)
Potassium: 4.5 mmol/L (ref 3.5–5.1)
Sodium: 140 mmol/L (ref 135–145)

## 2020-01-22 LAB — I-STAT BETA HCG BLOOD, ED (MC, WL, AP ONLY): I-stat hCG, quantitative: <5 m[IU]/mL (ref <5)

## 2020-01-22 MED ORDER — DIPHENHYDRAMINE HCL 25 MG PO CAPS
25.0000 mg | ORAL_CAPSULE | Freq: Once | ORAL | Status: AC
  Administered 2020-01-22: 25 mg via ORAL
  Filled 2020-01-22: qty 1

## 2020-01-22 MED ORDER — IBUPROFEN 400 MG PO TABS
600.0000 mg | ORAL_TABLET | Freq: Once | ORAL | Status: AC
  Administered 2020-01-22: 600 mg via ORAL
  Filled 2020-01-22: qty 1

## 2020-01-22 MED ORDER — SODIUM CHLORIDE 0.9% FLUSH: 3.0000 mL | Freq: Once | INTRAVENOUS | Status: DC

## 2020-01-22 MED ORDER — METOCLOPRAMIDE HCL 5 MG/ML IJ SOLN
10.0000 mg | Freq: Once | INTRAMUSCULAR | Status: AC
  Administered 2020-01-22: 10 mg via INTRAMUSCULAR
  Filled 2020-01-22: qty 2

## 2020-01-22 NOTE — ED Triage Notes (Signed)
Pt states that yesterday while at the bus stop, she passed out, however, states she did not fall and was able to catch herself. States that she is also having headaches and trouble breathing, thinks it could be related to allergies. Endorses occasional cough, denies fever or chills. A/ox4, resp e/u, nad.

## 2020-01-22 NOTE — Progress Notes (Signed)
Right lower extremity venous duplex has been completed. Preliminary results can be found in CV Proc through chart review.  Results were given to Dr. Juleen China.  01/22/20 11:21 AM Olen Cordial RVT

## 2020-01-22 NOTE — ED Notes (Signed)
Pt ambulated to exam room with steady gait per ED Tech, Imani B.

## 2020-01-29 NOTE — ED Provider Notes (Signed)
[facility] EMERGENCY DEPARTMENT Provider Note   CSN: [id number] Arrival date & time: 01/22/20  0347     History Chief Complaint  Patient presents with  . Loss of Consciousness    [name] is a 24 y.o. female.  HPI   24 year old female with numerous complaints.  Primarily near syncope/dizziness.  Yesterday at a bus stop she was waiting when she began to feel lightheaded.  She started to fall but was able to catch herself.  Numerous other complaints.  Some intermittent frontal headaches.  She is not sure this related to seasonal allergies or not.  Also some dyspnea.  Lower extremity edema, right greater than left.  This is been ongoing for years.  Denies any acute change.  Past Medical History:  Diagnosis Date  . Bipolar 1 disorder [facility])     Patient Active Problem List   Diagnosis Date Noted  . Cellulitis of right leg 03/28/2019  . Cellulitis 03/28/2019  . Hypokalemia 03/28/2019  . Hypomagnesemia 03/28/2019  . Iron deficiency anemia 03/28/2019  . Constipation 06/27/2018  . Cellulitis and abscess of right leg 06/24/2018  . Leucocytosis 06/24/2018  . Anemia 06/24/2018  . Bipolar affective disorder, depressed, mild ([place]) 02/09/2017  . Cannabis use disorder, moderate, dependence ([place]) 12/11/2016    History reviewed. No pertinent surgical history.   OB History   No obstetric history on file.     Family History  Problem Relation Age of Onset  . Drug abuse Mother     Social History   Tobacco Use  . Smoking status: Current Some Day Smoker    Types: Cigars  . Smokeless tobacco: Never Used  Substance Use Topics  . Alcohol use: Yes    Comment: occ  . Drug use: Yes    Types: Marijuana    Home Medications Prior to Admission medications   Medication Sig Start Date End Date Taking? Authorizing Provider  cetirizine (ZYRTEC) 10 MG tablet Take 10 mg by mouth daily.   Yes [provider]  iron polysaccharides (NIFEREX) 150 MG  capsule Take 1 capsule (150 mg total) by mouth daily. Patient not taking: Reported on 01/22/2020 03/30/19   Mendel Corning, MD  Multiple Vitamin (MULTIVITAMIN WITH MINERALS) TABS tablet Take 1 tablet by mouth daily. Patient not taking: Reported on 03/28/2019 06/29/18   Dana Allan I, MD  traMADol (ULTRAM) 50 MG tablet Take 1 tablet (50 mg total) by mouth every 8 (eight) hours as needed for moderate pain or severe pain. Patient not taking: Reported on 01/22/2020 03/30/19   Mendel Corning, MD    Allergies    Patient has no known allergies.  Review of Systems   Review of Systems All systems reviewed and negative, other than as noted in HPI. Physical Exam Updated Vital Signs BP (!) 104/59 (BP Location: Right Arm)   Pulse 60   Temp 98.4 F (36.9 C) (Oral)   Resp 17   LMP 01/19/2020   SpO2 98%   Physical Exam Vitals and nursing note reviewed.  Constitutional:      General: She is not in acute distress.    Appearance: She is well-developed.  HENT:     Head: Normocephalic and atraumatic.  Eyes:     General:        Right eye: No discharge.        Left eye: No discharge.     Conjunctiva/sclera: Conjunctivae normal.  Cardiovascular:     Rate and Rhythm: Normal rate and regular  rhythm.     Heart sounds: Normal heart sounds. No murmur. No friction rub. No gallop.   Pulmonary:     Effort: Pulmonary effort is normal. No respiratory distress.     Breath sounds: Normal breath sounds.  Abdominal:     General: There is no distension.     Palpations: Abdomen is soft.     Tenderness: There is no abdominal tenderness.  Musculoskeletal:        General: No tenderness.     Cervical back: Neck supple.     Right lower leg: Edema present.     Left lower leg: Edema present.     Comments: Lower extremity edema, right greater than left.  No calf tenderness.  Negative Homans.  No skin changes concerning for possible infection.  Skin:    General: Skin is warm and dry.  Neurological:     Mental  Status: She is alert.  Psychiatric:        Behavior: Behavior normal.        Thought Content: Thought content normal.     ED Results / Procedures / Treatments   Labs (all labs ordered are listed, but only abnormal results are displayed) Labs Reviewed  BASIC METABOLIC PANEL - Abnormal; Notable for the following components:      Result Value   BUN <5 (*)    All other components within normal limits  CBC - Abnormal; Notable for the following components:   Hemoglobin 11.4 (*)    MCV 72.9 (*)    MCH 22.7 (*)    All other components within normal limits  URINALYSIS, ROUTINE W REFLEX MICROSCOPIC - Abnormal; Notable for the following components:   APPearance HAZY (*)    All other components within normal limits  CBG MONITORING, ED  I-STAT BETA HCG BLOOD, ED ([facility], [facility], [facility] ONLY)    EKG EKG Interpretation  Date/Time:  Thursday Jan 22 2020 07:22:35 EDT Ventricular Rate:  70 PR Interval:  178 QRS Duration: 80 QT Interval:  374 QTC Calculation: 403 R Axis:   -5 Text Interpretation: Normal sinus rhythm with sinus arrhythmia Normal ECG Confirmed by Raeford Razor [phone]) on 01/22/2020 10:34:49 AM   Radiology No results found.  Procedures Procedures (including critical care time)  Medications Ordered in ED Medications  metoCLOPramide (REGLAN) injection 10 mg (10 mg Intramuscular Given 01/22/20 1120)  diphenhydrAMINE (BENADRYL) capsule 25 mg (25 mg Oral Given 01/22/20 1120)  ibuprofen (ADVIL) tablet 600 mg (600 mg Oral Given 01/22/20 1120)    ED Course  I have reviewed the triage vital signs and the nursing notes.  Pertinent labs & imaging results that were available during my care of the patient were reviewed by me and considered in my medical decision making (see chart for details).    MDM Rules/Calculators/A&P                      24 year old female with numerous complaints.  Doubt emergent process.  He medically stable.  Afebrile.  Nontoxic.  Neuro exam is nonfocal.   Headache treated symptomatically with improvement.  Ultrasound negative for DVT.  Outpatient follow-up.  Final Clinical Impression(s) / ED Diagnoses Final diagnoses:  Nonintractable headache, unspecified chronicity pattern, unspecified headache type  Leg edema, right    Rx / DC Orders ED Discharge Orders    None       Raeford Razor, MD 01/29/20 1004

## 2020-08-21 IMAGING — DX RIGHT ANKLE - 2 VIEW
2 series · 2 of 2 positions shown · non-contrast
Comparison: None.

CLINICAL DATA: RIGHT ankle swelling.

EXAM:
RIGHT ANKLE - 2 VIEW

[ankle ap]
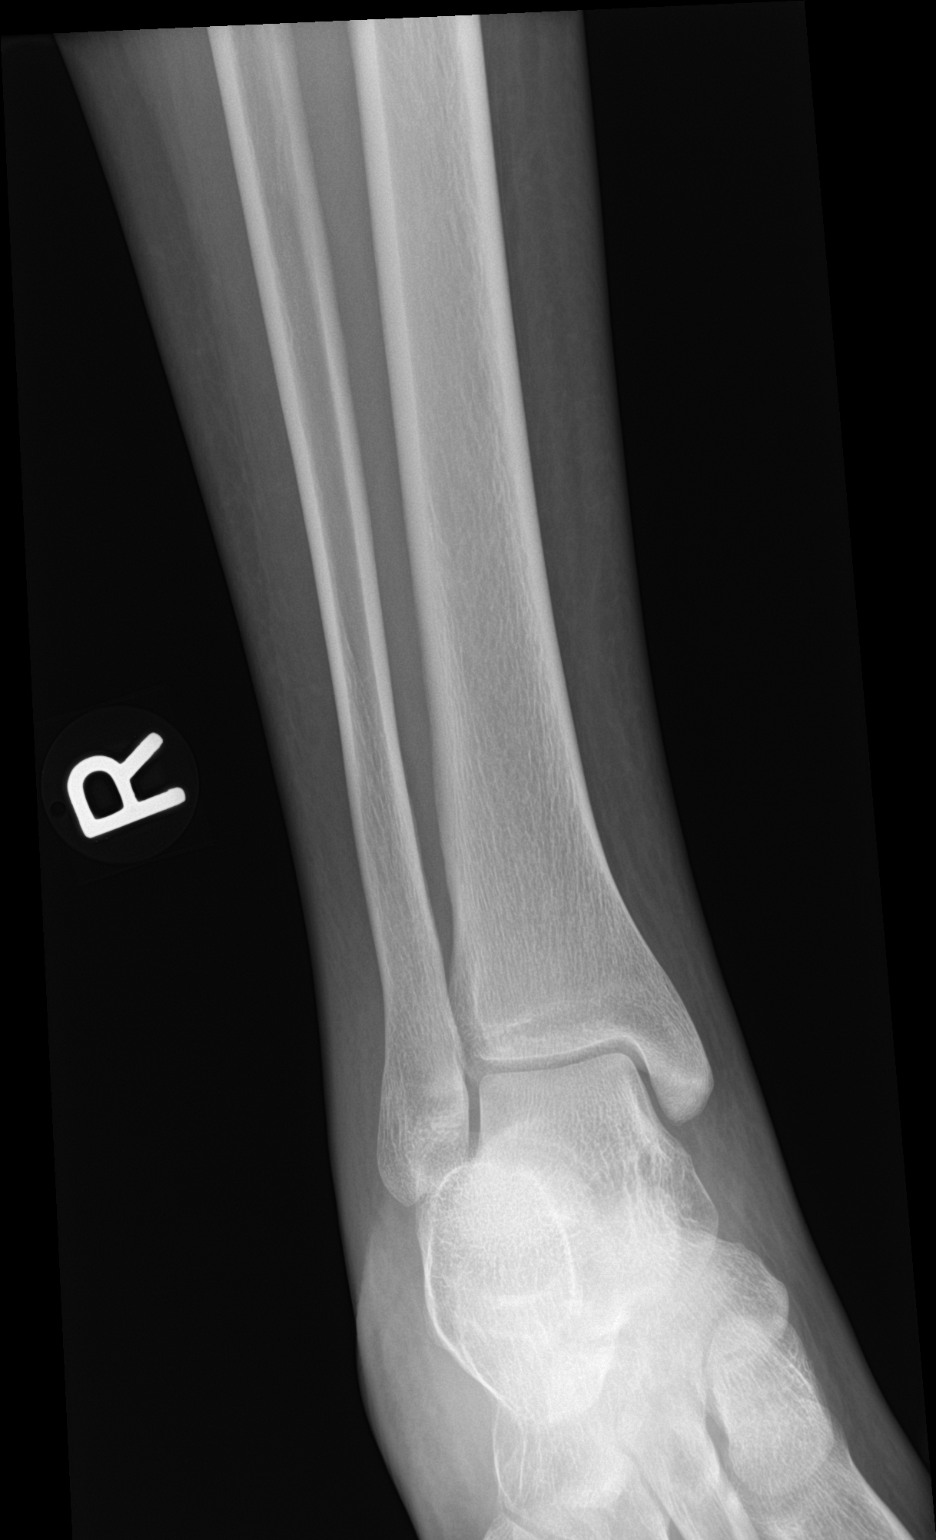

[ankle lat]
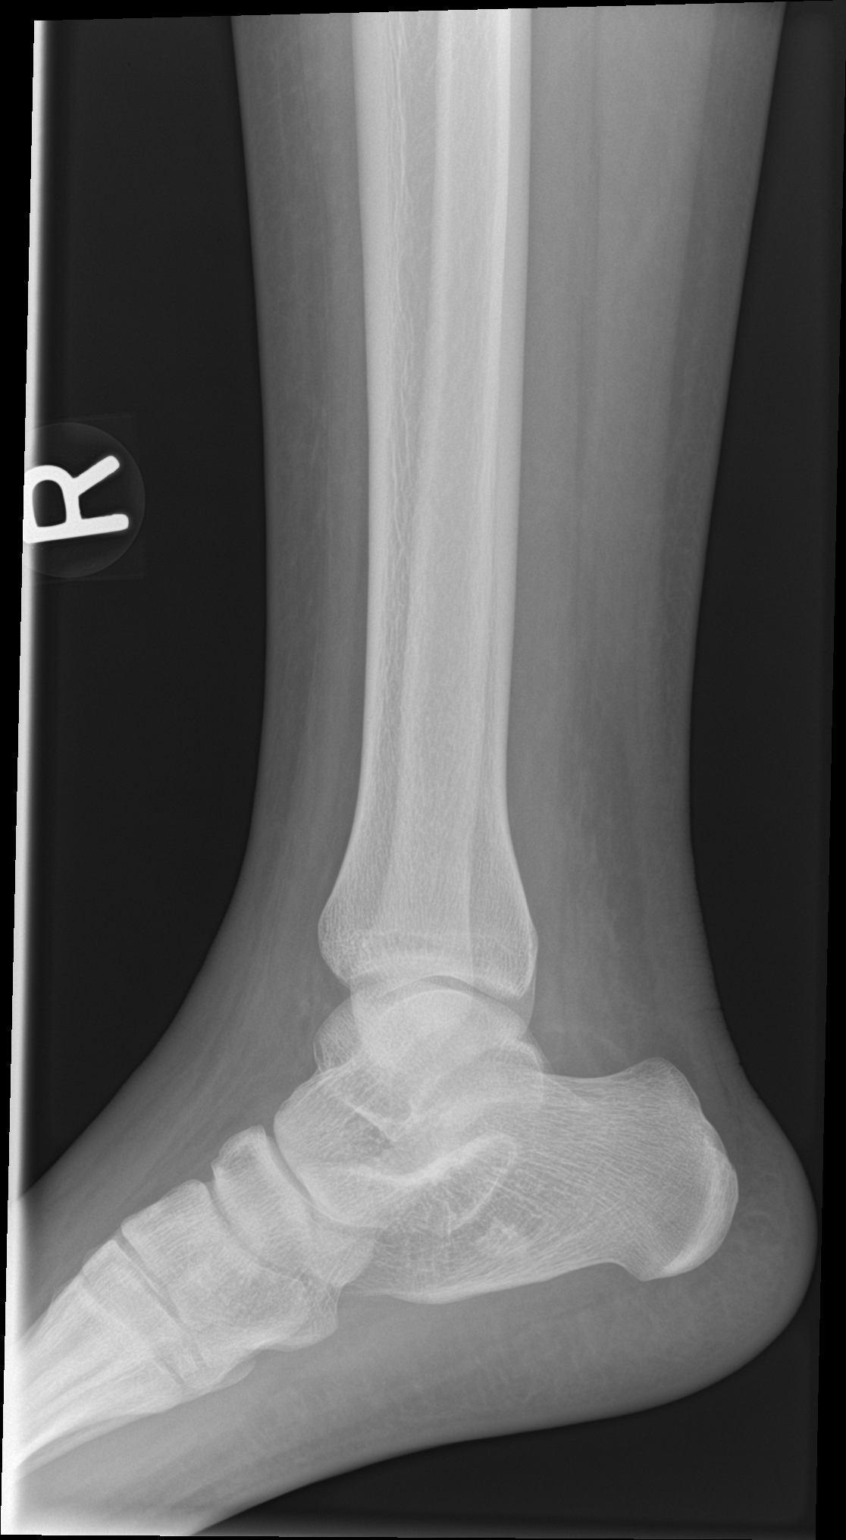

[2 of 2 positions shown; findings below may reference images not displayed]

FINDINGS: No bony abnormalities are identified.

There is no evidence of fracture, subluxation or dislocation.

The joint spaces are unremarkable.

Soft tissue swelling is noted.
IMPRESSION: Soft tissue swelling without bony or joint abnormality.

## 2020-08-28 NOTE — L&D Delivery Note (Signed)
Delivery Note Labor onset:   Labor Onset Time: 1000 Complete dilation at 1:11 AM  Onset of pushing at 111am FHR second stage Cat 2 Analgesia/Anesthesia intrapartum: epidural  Guided pushing with maternal urge. Delivery of a viable female at 47. Fetal head delivered in OA with restitution to ROA position.  Nuchal cord: N/A Infant placed on maternal abd, dried, and tactile stim. Spontaneous cry. Cord double clamped after pulsation ceased and cut by patient's mother.  3 RNs present for birth.  Cord blood sample collected: yes Arterial cord blood sample collected: N/A  Placenta delivered Tomasa Blase via Binnie Kand Maneuver, intact, with 3 VC.  Placenta to L&D. Uterine tone firm, bleeding minimal  2nd degree perineal laceration identified.  Anesthesia: epidural Repair: 3-0 vicryl QBL/EBL (mL): 100 Complications: N/A APGAR: APGAR (1 MIN): 9   APGAR (5 MINS): 9   APGAR (10 MINS):   Mom to postpartum.  Baby to Couplet care / Skin to Skin.  Gerhard Munch Jasmane Brockway MSN, CNM 01/31/2021, 2:32 AM

## 2020-12-07 ENCOUNTER — Other Ambulatory Visit: Payer: Self-pay

## 2020-12-07 ENCOUNTER — Ambulatory Visit (INDEPENDENT_AMBULATORY_CARE_PROVIDER_SITE_OTHER): Payer: Self-pay | Admitting: Pediatrics

## 2020-12-07 DIAGNOSIS — Z7681 Expectant parent(s) prebirth pediatrician visit: Secondary | ICD-10-CM

## 2020-12-19 NOTE — Progress Notes (Signed)
Prenatal counseling for impending newborn done--  Reviewed current vaccine policy and answered all questions.  1st child, Currently 31 weeks, Current complications:  none, Prenatal care initiated:  Late  Z76.81

## 2021-01-28 ENCOUNTER — Other Ambulatory Visit: Payer: Self-pay | Admitting: Obstetrics & Gynecology

## 2021-01-30 ENCOUNTER — Inpatient Hospital Stay (HOSPITAL_COMMUNITY): Payer: Medicaid Other | Admitting: Anesthesiology

## 2021-01-30 ENCOUNTER — Inpatient Hospital Stay (HOSPITAL_COMMUNITY)
Admission: AD | Admit: 2021-01-30 | Discharge: 2021-02-01 | DRG: 807 | Disposition: A | Discharge status: Home / Self Care | Payer: Medicaid Other | Attending: Obstetrics and Gynecology | Admitting: Obstetrics and Gynecology

## 2021-01-30 ENCOUNTER — Encounter (HOSPITAL_COMMUNITY): Payer: Self-pay

## 2021-01-30 ENCOUNTER — Other Ambulatory Visit: Payer: Self-pay

## 2021-01-30 DIAGNOSIS — Z59811 Housing instability, housed, with risk of homelessness: Secondary | ICD-10-CM | POA: Diagnosis present

## 2021-01-30 DIAGNOSIS — O99824 Streptococcus B carrier state complicating childbirth: Principal | ICD-10-CM | POA: Diagnosis present

## 2021-01-30 DIAGNOSIS — F17290 Nicotine dependence, other tobacco product, uncomplicated: Secondary | ICD-10-CM | POA: Diagnosis present

## 2021-01-30 DIAGNOSIS — Z20822 Contact with and (suspected) exposure to covid-19: Secondary | ICD-10-CM | POA: Diagnosis present

## 2021-01-30 DIAGNOSIS — O99334 Smoking (tobacco) complicating childbirth: Secondary | ICD-10-CM | POA: Diagnosis present

## 2021-01-30 DIAGNOSIS — O26893 Other specified pregnancy related conditions, third trimester: Secondary | ICD-10-CM | POA: Diagnosis present

## 2021-01-30 DIAGNOSIS — Z3A40 40 weeks gestation of pregnancy: Secondary | ICD-10-CM

## 2021-01-30 LAB — OB RESULTS CONSOLE RPR: RPR: NONREACTIVE

## 2021-01-30 LAB — OB RESULTS CONSOLE ABO/RH: RH Type: POSITIVE

## 2021-01-30 LAB — RAPID HIV SCREEN (HIV 1/2 AB+AG)
HIV 1/2 Antibodies: NONREACTIVE
HIV-1 P24 Antigen - HIV24: NONREACTIVE

## 2021-01-30 LAB — OB RESULTS CONSOLE HEPATITIS B SURFACE ANTIGEN: Hepatitis B Surface Ag: NEGATIVE

## 2021-01-30 LAB — RESP PANEL BY RT-PCR (FLU A&B, COVID) ARPGX2
Influenza A by PCR: NEGATIVE
Influenza B by PCR: NEGATIVE
SARS Coronavirus 2 by RT PCR: NEGATIVE

## 2021-01-30 LAB — OB RESULTS CONSOLE ANTIBODY SCREEN: Antibody Screen: NEGATIVE

## 2021-01-30 LAB — OB RESULTS CONSOLE GC/CHLAMYDIA
Chlamydia: NEGATIVE
Gonorrhea: NEGATIVE

## 2021-01-30 LAB — TYPE AND SCREEN
ABO/RH(D): A POS
Antibody Screen: NEGATIVE

## 2021-01-30 LAB — OB RESULTS CONSOLE HIV ANTIBODY (ROUTINE TESTING): HIV: NONREACTIVE

## 2021-01-30 LAB — OB RESULTS CONSOLE GBS: GBS: POSITIVE

## 2021-01-30 LAB — CBC
HCT: 34.6 % — ABNORMAL LOW (ref 36.0–46.0)
Hemoglobin: 11 g/dL — ABNORMAL LOW (ref 12.0–15.0)
MCH: 23.8 pg — ABNORMAL LOW (ref 26.0–34.0)
MCHC: 31.8 g/dL (ref 30.0–36.0)
MCV: 74.9 fL — ABNORMAL LOW (ref 80.0–100.0)
Platelets: 167 10*3/uL (ref 150–400)
RBC: 4.62 MIL/uL (ref 3.87–5.11)
RDW: 14.6 % (ref 11.5–15.5)
WBC: 8.5 10*3/uL (ref 4.0–10.5)
nRBC: 0 % (ref 0.0–0.2)

## 2021-01-30 LAB — OB RESULTS CONSOLE RUBELLA ANTIBODY, IGM: Rubella: IMMUNE

## 2021-01-30 MED ORDER — LIDOCAINE HCL (PF) 1 % IJ SOLN: 30.0000 mL | INTRAMUSCULAR | Status: DC | PRN

## 2021-01-30 MED ORDER — FENTANYL CITRATE (PF) 100 MCG/2ML IJ SOLN: 50.0000 ug | INTRAMUSCULAR | Status: DC | PRN

## 2021-01-30 MED ORDER — OXYCODONE-ACETAMINOPHEN 5-325 MG PO TABS: 1.0000 | ORAL_TABLET | ORAL | Status: DC | PRN

## 2021-01-30 MED ORDER — LACTATED RINGERS IV SOLN: 500.0000 mL | Freq: Once | INTRAVENOUS | Status: DC

## 2021-01-30 MED ORDER — FENTANYL-BUPIVACAINE-NACL 0.5-0.125-0.9 MG/250ML-% EP SOLN
12.0000 mL/h | EPIDURAL | Status: DC | PRN
  Administered 2021-01-30: 12 mL/h via EPIDURAL
  Filled 2021-01-30: qty 250

## 2021-01-30 MED ORDER — PHENYLEPHRINE 40 MCG/ML (10ML) SYRINGE FOR IV PUSH (FOR BLOOD PRESSURE SUPPORT): 80.0000 ug | PREFILLED_SYRINGE | INTRAVENOUS | Status: DC | PRN

## 2021-01-30 MED ORDER — OXYCODONE-ACETAMINOPHEN 5-325 MG PO TABS: 2.0000 | ORAL_TABLET | ORAL | Status: DC | PRN

## 2021-01-30 MED ORDER — EPHEDRINE 5 MG/ML INJ: 10.0000 mg | INTRAVENOUS | Status: DC | PRN

## 2021-01-30 MED ORDER — LACTATED RINGERS IV SOLN: INTRAVENOUS | Status: DC

## 2021-01-30 MED ORDER — SOD CITRATE-CITRIC ACID 500-334 MG/5ML PO SOLN: 30.0000 mL | ORAL | Status: DC | PRN

## 2021-01-30 MED ORDER — DIPHENHYDRAMINE HCL 50 MG/ML IJ SOLN: 12.5000 mg | INTRAMUSCULAR | Status: DC | PRN

## 2021-01-30 MED ORDER — SODIUM CHLORIDE 0.9 % IV SOLN
5.0000 10*6.[IU] | Freq: Once | INTRAVENOUS | Status: AC
  Administered 2021-01-30: 5 10*6.[IU] via INTRAVENOUS
  Filled 2021-01-30: qty 5

## 2021-01-30 MED ORDER — ACETAMINOPHEN 325 MG PO TABS: 650.0000 mg | ORAL_TABLET | ORAL | Status: DC | PRN

## 2021-01-30 MED ORDER — PENICILLIN G POT IN DEXTROSE 60000 UNIT/ML IV SOLN
3.0000 10*6.[IU] | INTRAVENOUS | Status: DC
  Administered 2021-01-30 – 2021-01-31 (×2): 3 10*6.[IU] via INTRAVENOUS
  Filled 2021-01-30 (×2): qty 50

## 2021-01-30 MED ORDER — OXYTOCIN-SODIUM CHLORIDE 30-0.9 UT/500ML-% IV SOLN: 2.5000 [IU]/h | INTRAVENOUS | Status: DC

## 2021-01-30 MED ORDER — OXYTOCIN BOLUS FROM INFUSION
333.0000 mL | Freq: Once | INTRAVENOUS | Status: AC
  Administered 2021-01-31: 333 mL via INTRAVENOUS

## 2021-01-30 MED ORDER — TERBUTALINE SULFATE 1 MG/ML IJ SOLN: 0.2500 mg | Freq: Once | INTRAMUSCULAR | Status: DC | PRN

## 2021-01-30 MED ORDER — ONDANSETRON HCL 4 MG/2ML IJ SOLN: 4.0000 mg | Freq: Four times a day (QID) | INTRAMUSCULAR | Status: DC | PRN

## 2021-01-30 MED ORDER — LACTATED RINGERS IV SOLN
500.0000 mL | INTRAVENOUS | Status: DC | PRN
Start: 2021-01-30 — End: 2021-01-31

## 2021-01-30 MED ORDER — OXYTOCIN-SODIUM CHLORIDE 30-0.9 UT/500ML-% IV SOLN
1.0000 m[IU]/min | INTRAVENOUS | Status: DC
  Filled 2021-01-30: qty 500

## 2021-01-30 MED ORDER — LIDOCAINE HCL (PF) 1 % IJ SOLN
INTRAMUSCULAR | Status: DC | PRN
  Administered 2021-01-30: 11 mL via EPIDURAL

## 2021-01-30 NOTE — MAU Note (Signed)
Pt reports to mau with c/o ctx q4 min since 1000 today.  Denies LOF or vag bleeding. +FM

## 2021-01-30 NOTE — ED Triage Notes (Signed)
Patient states that she is [redacted] weeks pregnant and having contractions 3 minutes a part. G1, P0. Patient states that she was to be induced 6/8.

## 2021-01-30 NOTE — Anesthesia Procedure Notes (Signed)
Epidural Patient location during procedure: OB Start time: 01/30/2021 7:14 PM End time: 01/30/2021 7:27 PM  Staffing Anesthesiologist: Lowella Curb, MD Performed: anesthesiologist   Preanesthetic Checklist Completed: patient identified, IV checked, site marked, risks and benefits discussed, surgical consent, monitors and equipment checked, pre-op evaluation and timeout performed  Epidural Patient position: sitting Prep: ChloraPrep Patient monitoring: heart rate, cardiac monitor, continuous pulse ox and blood pressure Approach: midline Location: L2-L3 Injection technique: LOR saline  Needle:  Needle type: Tuohy  Needle gauge: 17 G Needle length: 9 cm Needle insertion depth: 7 cm Catheter type: closed end flexible Catheter size: 20 Guage Catheter at skin depth: 11 cm Test dose: negative  Assessment Events: blood not aspirated, injection not painful, no injection resistance, no paresthesia and negative IV test  Additional Notes Reason for block:procedure for pain

## 2021-01-30 NOTE — Progress Notes (Signed)
[name] is a 25 y.o. G1P0 at 100w4d admitted for contractions q 4 min.   Subjective:  [name] is coping well with ctx. SVE 3.5- 4/80/-1. PCN infusing for GBS prophylaxis. Encouraged ambulation and position change. AROM discussed with pt. R/B/A. Pt requesting to rupture membranes at next vaginal exam.  Objective: BP 122/78   Pulse 88   Temp 98.5 F (36.9 C) (Oral)   Resp 18   Ht 5\' 8"  (1.727 m)   Wt 95.3 kg   SpO2 100%   BMI 31.93 kg/m  No intake/output data recorded. No intake/output data recorded.  FHT:  FHR: 135 bpm, variability: moderate,  accelerations:  Present,  decelerations:  Absent UC:   regular, every 4 minutes SVE:   Dilation: 3.5 Effacement (%): 80 Station: -1 Exam by:: [email or web address], cnm  Labs: Lab Results  Component Value Date   WBC 7.0 01/22/2020   HGB 11.4 (L) 01/22/2020   HCT 36.6 01/22/2020   MCV 72.9 (L) 01/22/2020   PLT 281 01/22/2020    Assessment / Plan: Spontaneous labor, progressing normally.  Labor: Progressing normally, latent stage. Will AROM at next check. If ctx space start pitocin. Titrate 2x2. Pitocin discussed with pt. Fetal Wellbeing:  Category I Pain Control:  Epidural  PRN I/D:  GBS positive. PCN infusing now. Anticipated MOD:  NSVD  01/24/2020 MSN, CNM 01/30/2021, 5:24 PM

## 2021-01-30 NOTE — ED Provider Notes (Signed)
Emergency Medicine Provider OB Triage Evaluation Note  [name] is a 25 y.o. female, No obstetric history on file., at Unknown gestation who presents to the emergency department with complaints of contractions.  Pt reports she is having tightness every 3 minutes   Review of  Systems  Positive: cramping Negative: no blood, no fluid leaking   Physical Exam  BP (!) 127/92 (BP Location: Right Arm)   Pulse 94   Temp 99.1 F (37.3 C) (Oral)   Resp 18   SpO2 100%  General: Awake, no distress  HEENT: Atraumatic  Resp: Normal effort  Cardiac: Normal rate Abd: Nondistended, nontender  MSK: Moves all extremities without difficulty Neuro: Speech clear  Medical Decision Making  Pt evaluated for pregnancy concern and is stable for transfer to MAU. Pt is in agreement with plan for transfer.  1:51 PM Discussed with MAU APP, [name] who accepts patient in transfer.  Clinical Impression  No diagnosis found.     Elson Areas, [place] 01/30/21 1352    Milagros Loll, MD 02/01/21 2027

## 2021-01-30 NOTE — H&P (Signed)
OB ADMISSION/ HISTORY & PHYSICAL:  Admission Date: 01/30/2021  1:40 PM  Admit Diagnosis: Normal labor  [name] is a 25 y.o. female G1P0 [redacted]w[redacted]d presenting for ctx q 4 min. Endorses active FM, denies LOF and vaginal bleeding. Ctx began @ 1000.  History of current pregnancy: G1P0   Primary OB Provider: [facility] Patient entered care with [facility] at 24.6 wks.   EDC 01/26/21 by [place]. Unsure LMP at 04/21/21 and congruent w/ 24 wk U/S.   Anatomy scan:  24.5 wks, limited w/ posterior placenta.   Last evaluation: 40.1  wks  Significant prenatal events:  Patient Active Problem List   Diagnosis Date Noted  . Cellulitis of right leg 03/28/2019  . Cellulitis 03/28/2019  . Hypokalemia 03/28/2019  . Hypomagnesemia 03/28/2019  . Iron deficiency anemia 03/28/2019  . Constipation 06/27/2018  . Cellulitis and abscess of right leg 06/24/2018  . Leucocytosis 06/24/2018  . Anemia 06/24/2018  . Bipolar affective disorder, depressed, mild (HCC) 02/09/2017  . Cannabis use disorder, moderate, dependence (HCC) 12/11/2016    Prenatal Labs: ABO, Rh:  A positive Antibody:  Negative Rubella:   Immune RPR:   NR HBsAg:   Negative HIV:   Negative GTT: 74 GBS:   Positive GC/CHL: Negative/Negative Genetics: Declined Tdap/influenza vaccines: Declined   OB History  Gravida Para Term Preterm AB Living  1            SAB IAB Ectopic Multiple Live Births               # Outcome Date GA Lbr Len/2nd Weight Sex Delivery Anes PTL Lv  1 Current             Medical / Surgical History: Past medical history:  Past Medical History:  Diagnosis Date  . Bipolar 1 disorder (HCC)     Past surgical history: History reviewed. No pertinent surgical history. Family History:  Family History  Problem Relation Age of Onset  . Drug abuse Mother     Social History:  reports that she has been smoking cigars. She has never used smokeless tobacco. She reports current alcohol use. She reports current drug use. Drug:  Marijuana.  Allergies: Patient has no known allergies.   Current Medications at time of admission:  Prior to Admission medications   Medication Sig Start Date End Date Taking? Authorizing Provider  iron polysaccharides (NIFEREX) 150 MG capsule Take 1 capsule (150 mg total) by mouth daily. 03/30/19  Yes Rai, Ripudeep K, MD  Multiple Vitamin (MULTIVITAMIN WITH MINERALS) TABS tablet Take 1 tablet by mouth daily. 06/29/18  Yes Berton Mount I, MD  cetirizine (ZYRTEC) 10 MG tablet Take 10 mg by mouth daily.    [provider]  traMADol (ULTRAM) 50 MG tablet Take 1 tablet (50 mg total) by mouth every 8 (eight) hours as needed for moderate pain or severe pain. Patient not taking: Reported on 01/22/2020 03/30/19   Cathren Harsh, MD    Review of Systems: Constitutional: Negative   HENT: Negative   Eyes: Negative   Respiratory: Negative   Cardiovascular: Negative   Gastrointestinal: Negative  Genitourinary: negative for bloody show, negative for LOF   Musculoskeletal: Negative   Skin: Negative   Neurological: Negative   Endo/Heme/Allergies: Negative   Psychiatric/Behavioral: Negative    Physical Exam: VS: Blood pressure (!) 127/92, pulse 94, temperature 98.5 F (36.9 C), temperature source Oral, resp. rate 18, SpO2 100 %. AAO x3, no signs of distress Cardiovascular: RRR Respiratory: Lung fields clear to  ausculation GU/GI: Abdomen gravid, non-tender, non-distended, active FM, vertex Extremities: negative edema, negative for pain, tenderness, and cords  Cervical exam:Dilation: 3 Effacement (%): 70 Station: -3 Exam by:: Ginnie Smart RN FHR: baseline rate 135 / variability moderate / accelerations present / absent decelerations TOCO: 4 min   Prenatal Transfer Tool  Maternal Diabetes: No Genetic Screening: Normal Maternal Ultrasounds/Referrals: Normal Fetal Ultrasounds or other Referrals:  None Maternal Substance Abuse:  No Significant Maternal Medications:   None Significant Maternal Lab Results: Group B Strep positive    Assessment: 25 y.o. G1P0 [redacted]w[redacted]d admitted for spontaneous labor. C/O cxt q 4 min. Currently living at [facility], pleased with that situation.   Latent stage of labor FHR category 1 GBS Positive  Pain management plan: Epidural PRN   Plan:  Admit to L&D Routine admission orders Epidural PRN PP social work consult for living situation.   Dr Connye Burkitt notified of admission and plan of care  Carollee Leitz MSN, CNM 01/30/2021 3:58 PM

## 2021-01-30 NOTE — Progress Notes (Signed)
Subjective:    [name] is resting comfortably with epidural. SVE with little change. AROM R/B/A reviewed. Consent received. AROM without difficulty. Clear fluid. Pitocin discussed with pt. Pt agreed to use if needed.  Objective:    VS: BP 112/77   Pulse 86   Temp 98.4 F (36.9 C) (Oral)   Resp 18   Ht 5\' 8"  (1.727 m)   Wt 95.3 kg   SpO2 100%   BMI 31.93 kg/m  FHR : baseline 155 / variability moderate / accelerations absent / absent decelerations Toco: contractions every 2-7 minutes / Membranes: AROM @ 2102 Dilation: 4 Effacement (%): 90 Cervical Position: Posterior Station: -1 Presentation: Vertex Exam by:: Mateja Dier, CNM  Assessment/Plan:   25 y.o. G1P0 [redacted]w[redacted]d admitted for spontaneous labor. Epidural in place. AROM complete.   Labor: Progressing normally. May start pitocin if needed. Titrate 2x2. Preeclampsia:  no signs or symptoms of toxicity Fetal Wellbeing:  Category I Pain Control:  Epidural I/D:  GBS positive [facility] @ 1653, 2118 Anticipated MOD:  NSVD  Dr 2119 updated on pt status and POC  Connye Burkitt MSN, CNM 01/30/2021 9:57 PM

## 2021-01-30 NOTE — Anesthesia Preprocedure Evaluation (Signed)
Anesthesia Evaluation  Patient identified by MRN, date of birth, ID band Patient awake    Reviewed: Allergy & Precautions, NPO status , Patient's Chart, lab work & pertinent test results  Airway Mallampati: II  TM Distance: >3 FB Neck ROM: Full    Dental no notable dental hx.    Pulmonary neg pulmonary ROS, Current Smoker,    Pulmonary exam normal breath sounds clear to auscultation       Cardiovascular negative cardio ROS Normal cardiovascular exam Rhythm:Regular Rate:Normal     Neuro/Psych negative neurological ROS  negative psych ROS   GI/Hepatic negative GI ROS, Neg liver ROS,   Endo/Other  negative endocrine ROS  Renal/GU negative Renal ROS  negative genitourinary   Musculoskeletal negative musculoskeletal ROS (+)   Abdominal   Peds negative pediatric ROS (+)  Hematology negative hematology ROS (+)   Anesthesia Other Findings   Reproductive/Obstetrics (+) Pregnancy                             Anesthesia Physical Anesthesia Plan  ASA: II  Anesthesia Plan: Epidural   Post-op Pain Management:    Induction:   PONV Risk Score and Plan:   Airway Management Planned:   Additional Equipment:   Intra-op Plan:   Post-operative Plan:   Informed Consent:   Plan Discussed with:   Anesthesia Plan Comments:         Anesthesia Quick Evaluation  

## 2021-01-31 ENCOUNTER — Encounter (HOSPITAL_COMMUNITY): Payer: Self-pay | Admitting: Obstetrics and Gynecology

## 2021-01-31 LAB — CBC
HCT: 34.8 % — ABNORMAL LOW (ref 36.0–46.0)
Hemoglobin: 11.2 g/dL — ABNORMAL LOW (ref 12.0–15.0)
MCH: 24.2 pg — ABNORMAL LOW (ref 26.0–34.0)
MCHC: 32.2 g/dL (ref 30.0–36.0)
MCV: 75.3 fL — ABNORMAL LOW (ref 80.0–100.0)
Platelets: 161 10*3/uL (ref 150–400)
RBC: 4.62 MIL/uL (ref 3.87–5.11)
RDW: 14.6 % (ref 11.5–15.5)
WBC: 13.2 10*3/uL — ABNORMAL HIGH (ref 4.0–10.5)
nRBC: 0 % (ref 0.0–0.2)

## 2021-01-31 LAB — RPR: RPR Ser Ql: NONREACTIVE

## 2021-01-31 MED ORDER — IBUPROFEN 600 MG PO TABS
600.0000 mg | ORAL_TABLET | Freq: Four times a day (QID) | ORAL | Status: DC
  Administered 2021-01-31 – 2021-02-01 (×6): 600 mg via ORAL
  Filled 2021-01-31 (×6): qty 1

## 2021-01-31 MED ORDER — OXYCODONE HCL 5 MG PO TABS: 10.0000 mg | ORAL_TABLET | ORAL | Status: DC | PRN

## 2021-01-31 MED ORDER — ZOLPIDEM TARTRATE 5 MG PO TABS: 5.0000 mg | ORAL_TABLET | Freq: Every evening | ORAL | Status: DC | PRN

## 2021-01-31 MED ORDER — ONDANSETRON HCL 4 MG/2ML IJ SOLN: 4.0000 mg | INTRAMUSCULAR | Status: DC | PRN

## 2021-01-31 MED ORDER — SENNOSIDES-DOCUSATE SODIUM 8.6-50 MG PO TABS
2.0000 | ORAL_TABLET | Freq: Every day | ORAL | Status: DC
  Administered 2021-02-01: 2 via ORAL
  Filled 2021-01-31: qty 2

## 2021-01-31 MED ORDER — BENZOCAINE-MENTHOL 20-0.5 % EX AERO
1.0000 "application " | INHALATION_SPRAY | CUTANEOUS | Status: DC | PRN
  Administered 2021-01-31: 1 via TOPICAL
  Filled 2021-01-31: qty 56

## 2021-01-31 MED ORDER — ACETAMINOPHEN 325 MG PO TABS
650.0000 mg | ORAL_TABLET | ORAL | Status: DC | PRN
  Administered 2021-01-31: 650 mg via ORAL
  Filled 2021-01-31: qty 2

## 2021-01-31 MED ORDER — SIMETHICONE 80 MG PO CHEW: 80.0000 mg | CHEWABLE_TABLET | ORAL | Status: DC | PRN

## 2021-01-31 MED ORDER — PRENATAL MULTIVITAMIN CH
1.0000 | ORAL_TABLET | Freq: Every day | ORAL | Status: DC
  Administered 2021-01-31 – 2021-02-01 (×2): 1 via ORAL
  Filled 2021-01-31 (×2): qty 1

## 2021-01-31 MED ORDER — WITCH HAZEL-GLYCERIN EX PADS: 1.0000 "application " | MEDICATED_PAD | CUTANEOUS | Status: DC | PRN

## 2021-01-31 MED ORDER — OXYCODONE HCL 5 MG PO TABS
5.0000 mg | ORAL_TABLET | ORAL | Status: DC | PRN
Start: 2021-01-31 — End: 2021-02-01

## 2021-01-31 MED ORDER — ONDANSETRON HCL 4 MG PO TABS: 4.0000 mg | ORAL_TABLET | ORAL | Status: DC | PRN

## 2021-01-31 MED ORDER — DIBUCAINE (PERIANAL) 1 % EX OINT: 1.0000 "application " | TOPICAL_OINTMENT | CUTANEOUS | Status: DC | PRN

## 2021-01-31 MED ORDER — COCONUT OIL OIL: 1.0000 "application " | TOPICAL_OIL | Status: DC | PRN

## 2021-01-31 MED ORDER — TETANUS-DIPHTH-ACELL PERTUSSIS 5-2.5-18.5 LF-MCG/0.5 IM SUSY: 0.5000 mL | PREFILLED_SYRINGE | Freq: Once | INTRAMUSCULAR | Status: DC

## 2021-01-31 MED ORDER — DIPHENHYDRAMINE HCL 25 MG PO CAPS: 25.0000 mg | ORAL_CAPSULE | Freq: Four times a day (QID) | ORAL | Status: DC | PRN

## 2021-01-31 NOTE — Anesthesia Postprocedure Evaluation (Signed)
Anesthesia Post Note  Patient: Artemis Scotti  Procedure(s) Performed: AN AD HOC LABOR EPIDURAL     Patient location during evaluation: Mother Baby Anesthesia Type: Epidural Level of consciousness: awake and alert Pain management: pain level controlled Vital Signs Assessment: post-procedure vital signs reviewed and stable Respiratory status: spontaneous breathing, nonlabored ventilation and respiratory function stable Cardiovascular status: stable Postop Assessment: no headache, no backache, epidural receding, no apparent nausea or vomiting, patient able to bend at knees, adequate PO intake and able to ambulate Anesthetic complications: no   No complications documented.  Last Vitals:  Vitals:   01/31/21 0518 01/31/21 0833  BP: 119/86 106/68  Pulse: (!) 102 (!) 108  Resp: 18 16  Temp: 37.3 C 36.7 C  SpO2: 100% 100%    Last Pain:  Vitals:   01/31/21 0833  TempSrc: Oral  PainSc: 0-No pain   Pain Goal: Patients Stated Pain Goal: 2 (01/31/21 0518)                 Laban Emperor

## 2021-01-31 NOTE — Clinical Social Work Maternal (Signed)
CLINICAL SOCIAL WORK MATERNAL/CHILD NOTE  Patient Details  Name: [name] MRN: [id number] Date of Birth: 01/29/1996  Date:  2020/10/10  Clinical Social Worker Initiating Note:  [name], MSW, [place] Date/Time: Initiated:  Aug 07, 2021/1130     Child's Name:  [name]   Biological Parents:  Mother,Father ([name] 01/29/1986)   Need for Interpreter:  None   Reason for Referral:  Current Substance Use/Substance Use During Pregnancy ,Behavioral Health Concerns   Address:  [place] [address]    Phone number:  [phone] (home)     Additional phone number:   Household Members/Support Persons (HM/SP):       HM/SP Name Relationship DOB or Age  HM/SP -1        HM/SP -2        HM/SP -3        HM/SP -4        HM/SP -5        HM/SP -6        HM/SP -7        HM/SP -8          Natural Supports (not living in the home):  Immediate Family   Professional Supports: Therapist [name] with [facility] [place])   Employment: Unemployed   Type of Work:     Education:  Programmer, systems   Homebound arranged:    Museum/gallery curator Resources:  Medicaid   Other Resources:  Physicist, medical ,[place] Considerations Which May Impact Care:    Strengths:  Ability to meet basic needs ,Pediatrician chosen,Home prepared for child    Psychotropic Medications:         Pediatrician:    [facility] area  Pediatrician List:   [place]      Pediatrician Fax Number:    Risk Factors/Current Problems:  None   Cognitive State:  Alert ,Insightful ,Goal Oriented ,Linear Thinking    Mood/Affect:  Calm ,Interested ,Comfortable    CSW Assessment: CSW consulted for Bipolar, substance use and [facility] [place] client. CSW met with MOB to complete assessment and offer support. CSW introduced self and role. CSW observed  MOB holding sleeping infant. MOB was welcoming and receptive to visit. CSW informed MOB of reason for consult and assessed current emotions. MOB smiled as she stated "I'm doing really well." MOB also reported she had a good pregnancy without any complications. MOB stated she has been residing at [facility] [place] since the end of January and she is happy with the living situation. MOB reported that in addition to her mother and FOB, the placement offers a ton of support.   CSW inquired on MOB substance use. MOB reported she has not used THC since prior to going to [facility] [place] in January. MOB denies any additional substance use. CSW informed MOB of the hospital drug screen policy. MOB aware a CPS report will be made if infant UDS/CDS test positive for substances. MOB expressed understanding and denies any questions.   CSW discussed MOB mental health history. MOB reported she was diagnosed with Bipolar her junior year of high school, in 2016. MOB stated she was treated with medication when hospitalized in 2018, however she has not been on any medication since that time. MOB disclosed she has been in therapy with [name] of [facility] since February, which she finds to be very helpful. CSW asked MOB if she has had any SI since the 2018 hospitalization. MOB reported she has not had any SI in years. MOB denies experiencing any mental health symptoms during pregnancy and reports no current SI, HI or DV.   CSW provided education regarding the baby blues period versus PPD and provided resources. CSW provided the New Mom Checklist and encouraged MOB to self evaluate and contact a medical professional if symptoms are noted at any time.  CSW provided review of Sudden Infant Death Syndrome (SIDS) precautions. MOB stated she has all essentials for infant, including a car seat and bassinet. MOB reported she also has adequate transportation to infant's follow-up care. MOB stated she has no additional  needs at this time.  CSW will continue to follow UDS/CDS and make a CPS report if warranted. CSW identifies no further need for intervention and no barriers to discharge at this time.  CSW Plan/Description:  No Further Intervention Required/No Barriers to Discharge,Child Protective Service Report ,Hospital Drug Screen Policy Information,Perinatal Mood and Anxiety Disorder (PMADs) Education,CSW Will Continue to Monitor Umbilical Cord Tissue Drug Screen Results and Make Report if Warranted,Other Patient/Family Education,Sudden Infant Death Syndrome (SIDS) Education    [name] J [name], LCSWA 01/31/2021, 12:07 PM 

## 2021-02-01 DIAGNOSIS — Z59811 Housing instability, housed, with risk of homelessness: Secondary | ICD-10-CM | POA: Diagnosis present

## 2021-02-01 MED ORDER — IBUPROFEN 600 MG PO TABS: 600.0000 mg | ORAL_TABLET | Freq: Four times a day (QID) | ORAL | 0 refills | Status: AC

## 2021-02-01 MED ORDER — ACETAMINOPHEN 325 MG PO TABS: 650.0000 mg | ORAL_TABLET | ORAL | Status: AC | PRN

## 2021-02-01 NOTE — Discharge Summary (Signed)
Postpartum Discharge Summary  Date of Service updated 02/01/21    Patient Name: [name] DOB: 17-Mar-1996 MRN: [id number]  Date of admission: 01/30/2021 Delivery date:01/31/2021  Delivering provider: Holli Humbles B  Date of discharge: 02/01/2021  Admitting diagnosis: Normal labor and delivery [O80] Intrauterine pregnancy: [redacted]w[redacted]d    Secondary diagnosis:  Active Problems:   Normal labor and delivery   SVD (spontaneous vaginal delivery)   Normal postpartum course   Housing instability, currently housed, at risk for homelessness  Additional problems: none    Discharge diagnosis: Term Pregnancy Delivered                                              Post partum procedures:none Augmentation: AROM Complications: None  Hospital course: Onset of Labor With Vaginal Delivery      25y.o. yo G1P1001 at 490w5das admitted in Latent Labor on 01/30/2021. Patient had an uncomplicated labor course as follows:  Membrane Rupture Time/Date: 9:03 PM ,01/30/2021   Delivery Method:Vaginal, Spontaneous  Episiotomy: None  Lacerations:  2nd degree  Patient had an uncomplicated postpartum course.  She is ambulating, tolerating a regular diet, passing flatus, and urinating well. Patient is discharged home in stable condition on 02/01/21.  Newborn Data: Birth date:01/31/2021  Birth time:1:58 AM  Gender:Female  Living status:Living  Apgars:9 ,9  Weight:3606 g   Magnesium Sulfate received: No BMZ received: No Rhophylac:N/A MMR:N/A T-DaP:declined Flu: No Transfusion:No  Physical exam  Vitals:   01/31/21 1539 01/31/21 2105 02/01/21 0541 02/01/21 1322  BP: 117/78 114/74 103/70 115/79  Pulse: 94 86 70 86  Resp: '16 18 16 18  ' Temp: 97.6 F (36.4 C) 98.7 F (37.1 C) 98.4 F (36.9 C) 98.1 F (36.7 C)  TempSrc: Oral Oral Oral Oral  SpO2: 100% 100% 100% 100%  Weight:      Height:       General: alert, cooperative and no distress Lochia: appropriate Uterine Fundus: firm Incision:  N/A DVT Evaluation: No evidence of DVT seen on physical exam. No cords or calf tenderness. No significant calf/ankle edema. Labs: Lab Results  Component Value Date   WBC 13.2 (H) 01/31/2021   HGB 11.2 (L) 01/31/2021   HCT 34.8 (L) 01/31/2021   MCV 75.3 (L) 01/31/2021   PLT 161 01/31/2021   CMP Latest Ref Rng & Units 01/22/2020  Glucose 70 - 99 mg/dL 95  BUN 6 - 20 mg/dL <5(L)  Creatinine 0.44 - 1.00 mg/dL 0.63  Sodium 135 - 145 mmol/L 140  Potassium 3.5 - 5.1 mmol/L 4.5  Chloride 98 - 111 mmol/L 105  CO2 22 - 32 mmol/L 26  Calcium 8.9 - 10.3 mg/dL 9.0  Total Protein 6.5 - 8.1 g/dL -  Total Bilirubin 0.3 - 1.2 mg/dL -  Alkaline Phos 38 - 126 U/L -  AST 15 - 41 U/L -  ALT 0 - 44 U/L -   Edinburgh Score: Edinburgh Postnatal Depression Scale Screening Tool 01/31/2021  I have been able to laugh and see the funny side of things. 0  I have looked forward with enjoyment to things. 0  I have blamed myself unnecessarily when things went wrong. 2  I have been anxious or worried for no good reason. 0  I have felt scared or panicky for no good reason. 0  Things have been getting on top of me. 1  I have been so unhappy that I have had difficulty sleeping. 0  I have felt sad or miserable. 1  I have been so unhappy that I have been crying. 0  The thought of harming myself has occurred to me. 0  Edinburgh Postnatal Depression Scale Total 4      After visit meds:  Allergies as of 02/01/2021   No Known Allergies     Medication List    STOP taking these medications   traMADol 50 MG tablet Commonly known as: ULTRAM     TAKE these medications   acetaminophen 325 MG tablet Commonly known as: Tylenol Take 2 tablets (650 mg total) by mouth every 4 (four) hours as needed (for pain scale < 4).   cetirizine 10 MG tablet Commonly known as: ZYRTEC Take 10 mg by mouth daily.   ibuprofen 600 MG tablet Commonly known as: ADVIL Take 1 tablet (600 mg total) by mouth every 6 (six) hours.    iron polysaccharides 150 MG capsule Commonly known as: NIFEREX Take 1 capsule (150 mg total) by mouth daily.   multivitamin with minerals Tabs tablet Take 1 tablet by mouth daily.        Discharge home in stable condition Infant Feeding: Bottle Infant Disposition:home with mother Discharge instruction: per After Visit Summary and Postpartum booklet. Activity: Advance as tolerated. Pelvic rest for 6 weeks.  Diet: low salt diet Anticipated Birth Control: Unsure and Disucssed all options. Remains unsure Postpartum Appointment:6 weeks Additional Postpartum F/U: none Future Appointments:No future appointments. Follow up Visit:  [facility]. Go in 6 week(s).   Specialty: Obstetrics and Gynecology Contact information: [address] [place] [address] [phone]                  02/01/2021 Arrie Eastern, CNM

## 2021-02-02 ENCOUNTER — Inpatient Hospital Stay (HOSPITAL_COMMUNITY)
Admission: AD | Admit: 2021-02-02 | Payer: Medicaid Other | Source: Home / Self Care | Admitting: Obstetrics & Gynecology

## 2021-02-02 ENCOUNTER — Inpatient Hospital Stay (HOSPITAL_COMMUNITY): Payer: Medicaid Other

## 2021-03-08 ENCOUNTER — Other Ambulatory Visit (HOSPITAL_COMMUNITY): Payer: Self-pay | Admitting: Obstetrics and Gynecology

## 2021-03-08 ENCOUNTER — Other Ambulatory Visit: Payer: Self-pay

## 2021-03-08 ENCOUNTER — Ambulatory Visit (HOSPITAL_COMMUNITY)
Admission: RE | Admit: 2021-03-08 | Discharge: 2021-03-08 | Disposition: A | Discharge status: Home / Self Care | Payer: Medicaid Other | Source: Ambulatory Visit | Attending: Obstetrics and Gynecology | Admitting: Obstetrics and Gynecology

## 2021-03-08 DIAGNOSIS — M7989 Other specified soft tissue disorders: Secondary | ICD-10-CM

## 2021-03-08 DIAGNOSIS — L03119 Cellulitis of unspecified part of limb: Secondary | ICD-10-CM | POA: Diagnosis not present

## 2021-06-04 ENCOUNTER — Emergency Department (HOSPITAL_COMMUNITY)
Admission: EM | Admit: 2021-06-04 | Discharge: 2021-06-04 | Disposition: A | Discharge status: Home / Self Care | Payer: Medicaid Other | Attending: Emergency Medicine | Admitting: Emergency Medicine

## 2021-06-04 ENCOUNTER — Emergency Department (HOSPITAL_COMMUNITY): Payer: Medicaid Other

## 2021-06-04 ENCOUNTER — Encounter (HOSPITAL_COMMUNITY): Payer: Self-pay | Admitting: Emergency Medicine

## 2021-06-04 ENCOUNTER — Other Ambulatory Visit: Payer: Self-pay

## 2021-06-04 DIAGNOSIS — F17290 Nicotine dependence, other tobacco product, uncomplicated: Secondary | ICD-10-CM | POA: Insufficient documentation

## 2021-06-04 DIAGNOSIS — R0602 Shortness of breath: Secondary | ICD-10-CM | POA: Diagnosis present

## 2021-06-04 DIAGNOSIS — R0789 Other chest pain: Secondary | ICD-10-CM | POA: Insufficient documentation

## 2021-06-04 LAB — CBC WITH DIFFERENTIAL/PLATELET
Abs Immature Granulocytes: 0.01 10*3/uL (ref 0.00–0.07)
Basophils Absolute: 0 10*3/uL (ref 0.0–0.1)
Basophils Relative: 0 %
Eosinophils Absolute: 0.1 10*3/uL (ref 0.0–0.5)
Eosinophils Relative: 1 %
HCT: 41.2 % (ref 36.0–46.0)
Hemoglobin: 12.5 g/dL (ref 12.0–15.0)
Immature Granulocytes: 0 %
Lymphocytes Relative: 52 %
Lymphs Abs: 2.3 10*3/uL (ref 0.7–4.0)
MCH: 22.3 pg — ABNORMAL LOW (ref 26.0–34.0)
MCHC: 30.3 g/dL (ref 30.0–36.0)
MCV: 73.4 fL — ABNORMAL LOW (ref 80.0–100.0)
Monocytes Absolute: 0.4 10*3/uL (ref 0.1–1.0)
Monocytes Relative: 9 %
Neutro Abs: 1.7 10*3/uL (ref 1.7–7.7)
Neutrophils Relative %: 38 %
Platelets: 288 10*3/uL (ref 150–400)
RBC: 5.61 MIL/uL — ABNORMAL HIGH (ref 3.87–5.11)
RDW: 14.3 % (ref 11.5–15.5)
WBC: 4.5 10*3/uL (ref 4.0–10.5)
nRBC: 0 % (ref 0.0–0.2)

## 2021-06-04 LAB — I-STAT BETA HCG BLOOD, ED (MC, WL, AP ONLY): I-stat hCG, quantitative: <5 m[IU]/mL (ref <5)

## 2021-06-04 LAB — BASIC METABOLIC PANEL
Anion gap: 7 (ref 5–15)
BUN: 8 mg/dL (ref 6–20)
CO2: 23 mmol/L (ref 22–32)
Calcium: 9.3 mg/dL (ref 8.9–10.3)
Chloride: 106 mmol/L (ref 98–111)
Creatinine, Ser: 0.62 mg/dL (ref 0.44–1.00)
GFR, Estimated: >60 mL/min (ref >60)
Glucose, Bld: 84 mg/dL (ref 70–99)
Potassium: 3.7 mmol/L (ref 3.5–5.1)
Sodium: 136 mmol/L (ref 135–145)

## 2021-06-04 LAB — BRAIN NATRIURETIC PEPTIDE: B Natriuretic Peptide: 5.4 pg/mL (ref 0.0–100.0)

## 2021-06-04 LAB — D-DIMER, QUANTITATIVE: D-Dimer, Quant: <0.27 ug/mL-FEU (ref 0.00–0.50)

## 2021-06-04 MED ORDER — ALBUTEROL SULFATE HFA 108 (90 BASE) MCG/ACT IN AERS
2.0000 | INHALATION_SPRAY | Freq: Once | RESPIRATORY_TRACT | Status: AC
  Administered 2021-06-04: 2 via RESPIRATORY_TRACT
  Filled 2021-06-04: qty 6.7

## 2021-06-04 NOTE — ED Triage Notes (Signed)
C/o SOB since yesterday and L sided chest pain today.

## 2021-06-04 NOTE — Discharge Instructions (Addendum)
Use inhaler 1-2 puffs every 4-6 hours as needed. Follow up with your doctor for recheck. Return to the ER for worsening or concerning symptoms.

## 2021-06-04 NOTE — ED Provider Notes (Signed)
[facility] EMERGENCY DEPARTMENT Provider Note   CSN: [id number] Arrival date & time: 06/04/21  0909     History Chief Complaint  Patient presents with   Shortness of Breath    [name] is a 25 y.o. female.  25 year old with with complaint of shortness of breath and chest pain.  Patient states that symptoms started yesterday when she felt a little short of breath off and on with an occasional sharp pain on the left side of her chest.  Patient states her symptoms woke her from her sleep at 4:00 this morning and feels like she is having to put her arms up over her head to try and breathe.  Reports remote history of asthma as a young child, does not have an inhaler, states she is never had an asthma attack.  Symptoms are not worse with exertion or changes in position.  States that she is living in a maternity home (delivery in June), there have been people in other parts of the home with COVID but no direct exposure that she is aware of.  She denies fevers, chills, body aches, congestion.  No history of PE or DVT.  Is a non-smoker (quit smoking 1 month ago), not on any hormone type medications, no recent extended travel.  States that she did have swelling in her legs and came to the ER and had a negative Doppler study, diagnosed with cellulitis.      Past Medical History:  Diagnosis Date   Bipolar 1 disorder [facility])     Patient Active Problem List   Diagnosis Date Noted   Housing instability, currently housed, at risk for homelessness 02/01/2021   SVD (spontaneous vaginal delivery) 01/31/2021   Normal postpartum course 01/31/2021   Normal labor and delivery 01/30/2021   Iron deficiency anemia 03/28/2019   Bipolar affective disorder, depressed, mild (HCC) 02/09/2017   Cannabis use disorder, moderate, dependence (HCC) 12/11/2016    Past Surgical History:  Procedure Laterality Date   NO PAST SURGERIES       OB History     Gravida  1   Para  1   Term   1   Preterm      AB      Living  1      SAB      IAB      Ectopic      Multiple      Live Births  1           Family History  Problem Relation Age of Onset   Drug abuse Mother     Social History   Tobacco Use   Smoking status: Some Days    Types: Cigars   Smokeless tobacco: Never  Substance Use Topics   Alcohol use: Yes    Comment: occ   Drug use: Yes    Types: Marijuana    Home Medications Prior to Admission medications   Medication Sig Start Date End Date Taking? Authorizing Provider  acetaminophen (TYLENOL) 325 MG tablet Take 2 tablets (650 mg total) by mouth every 4 (four) hours as needed (for pain scale < 4). 02/01/21   Roma Schanz, CNM  cetirizine (ZYRTEC) 10 MG tablet Take 10 mg by mouth daily.    [provider]  ibuprofen (ADVIL) 600 MG tablet Take 1 tablet (600 mg total) by mouth every 6 (six) hours. 02/01/21   Roma Schanz, CNM  iron polysaccharides (NIFEREX) 150 MG capsule Take 1  capsule (150 mg total) by mouth daily. 03/30/19   Rai, Delene Ruffini, MD  Multiple Vitamin (MULTIVITAMIN WITH MINERALS) TABS tablet Take 1 tablet by mouth daily. 06/29/18   Barnetta Chapel, MD    Allergies    Patient has no known allergies.  Review of Systems   Review of Systems  Constitutional:  Negative for chills and fever.  HENT:  Negative for congestion.   Respiratory:  Positive for shortness of breath. Negative for cough and wheezing.   Cardiovascular:  Positive for chest pain. Negative for palpitations and leg swelling.  Gastrointestinal:  Negative for abdominal pain, nausea and vomiting.  Genitourinary:  Negative for dysuria.  Musculoskeletal:  Negative for arthralgias and myalgias.  Skin:  Negative for rash and wound.  Allergic/Immunologic: Negative for immunocompromised state.  Neurological:  Negative for weakness.  Hematological:  Negative for adenopathy.  Psychiatric/Behavioral:  Negative for confusion.   All other systems reviewed and  are negative.  Physical Exam Updated Vital Signs BP 106/63 (BP Location: Right Arm)   Pulse 83   Temp 98.1 F (36.7 C) (Oral)   Resp 14   LMP 05/14/2021   SpO2 100%   Physical Exam Vitals and nursing note reviewed.  Constitutional:      General: She is not in acute distress.    Appearance: She is well-developed. She is not diaphoretic.  HENT:     Head: Normocephalic and atraumatic.  Cardiovascular:     Rate and Rhythm: Normal rate and regular rhythm.     Heart sounds: No murmur heard. Pulmonary:     Effort: Pulmonary effort is normal.     Breath sounds: Normal breath sounds. No decreased breath sounds.  Chest:     Chest wall: No tenderness.  Musculoskeletal:     Cervical back: Neck supple.     Right lower leg: No tenderness. No edema.     Left lower leg: No tenderness. No edema.  Lymphadenopathy:     Cervical: No cervical adenopathy.  Skin:    General: Skin is warm and dry.     Findings: No erythema or rash.  Neurological:     Mental Status: She is alert and oriented to person, place, and time.  Psychiatric:        Behavior: Behavior normal.    ED Results / Procedures / Treatments   Labs (all labs ordered are listed, but only abnormal results are displayed) Labs Reviewed  CBC WITH DIFFERENTIAL/PLATELET - Abnormal; Notable for the following components:      Result Value   RBC 5.61 (*)    MCV 73.4 (*)    MCH 22.3 (*)    All other components within normal limits  BASIC METABOLIC PANEL  BRAIN NATRIURETIC PEPTIDE  D-DIMER, QUANTITATIVE  I-STAT BETA HCG BLOOD, ED ([facility], [facility], [facility] ONLY)    EKG None  Radiology DG Chest 2 View  Result Date: 06/04/2021 CLINICAL DATA:  Shortness of breath. EXAM: CHEST - 2 VIEW COMPARISON:  None. FINDINGS: The heart size and mediastinal contours are within normal limits. Both lungs are clear. The visualized skeletal structures are unremarkable. IMPRESSION: No active cardiopulmonary disease. Electronically Signed   By: Sherian Rein M.D.    On: 06/04/2021 10:17    Procedures Procedures   Medications Ordered in ED Medications  albuterol (VENTOLIN HFA) 108 (90 Base) MCG/ACT inhaler 2 puff (2 puffs Inhalation Given 06/04/21 1156)    ED Course  I have reviewed the triage vital signs and the nursing notes.  Pertinent  labs & imaging results that were available during my care of the patient were reviewed by me and considered in my medical decision making (see chart for details).  Clinical Course as of 06/04/21 1201  Sat Jun 04, 2021  3513 25 year old female with complaint of shortness of breath as above. On exam, lungs are clear, speaks in complete sentences, vitals reassuring with O2 sat 100% on room air, HR RRR. CXR unremarkable.  EKG without acute ischemic changes. Labs reassuring including normal BNP, negative d-dimer, doubt PE, BMP without significant electrolyte abnormality.  CBC unremarkable, no evidence of anemia as a cause for her shortness of breath. [LM]  1159 Consider possibly anxiety or stress related as a single mother to a 81-month-old living in a postpartum home.  Offered reassurance, will try albuterol inhaler due to history of asthma.  Encouraged follow-up with primary care provider.  Return to ED for worsening or concerning symptoms. [LM]    Clinical Course User Index [LM] Alden Hipp   MDM Rules/Calculators/A&P                           Final Clinical Impression(s) / ED Diagnoses Final diagnoses:  SOB (shortness of breath)    Rx / DC Orders ED Discharge Orders     None        Alden Hipp 06/04/21 1201    Gerhard Munch, MD 06/04/21 1605

## 2021-06-04 NOTE — ED Provider Notes (Signed)
Emergency Medicine Provider Triage Evaluation Note  [name] , a 25 y.o. female  was evaluated in triage.  Pt complains of shortness of breath and chest pain.  Patient states that symptoms started yesterday when she felt a little short of breath off and on with an occasional sharp pain on the left side of her chest.  Patient states her symptoms woke her from her sleep at 4:00 this morning and feels like she is having to put her arms up over her head to try and breathe.  Reports remote history of asthma as a young child, does not have an inhaler, states she is never had an asthma attack.  Symptoms are not worse with exertion or changes in position.  States that she is living in a maternity home (delivery in June), there have been people in other parts of the home with COVID but no direct exposure that she is aware of.  She denies fevers, chills, body aches, congestion.  No history of PE or DVT.  Is a non-smoker (quit smoking 1 month ago), not on any hormone type medications, no recent extended travel.  States that she did have swelling in her legs and came to the ER and had a negative Doppler study, diagnosed with cellulitis.  Review of Systems  Positive: Chest pain, shortness of breath Negative: Fevers, chills, body aches  Physical Exam  BP 104/78 (BP Location: Left Arm)   Pulse 84   Temp 98.3 F (36.8 C) (Oral)   Resp 17   LMP 05/14/2021   SpO2 100%  Gen:   Awake, no distress   Resp:  Normal effort  MSK:   Moves extremities without difficulty  Other:  Lungs clear to auscultation, no wheezing.  Able to speak in complete sentences without difficulty.  No lower extremity edema.  Medical Decision Making  Medically screening exam initiated at 9:34 AM.  Appropriate orders placed.  [name] was informed that the remainder of the evaluation will be completed by another provider, this initial triage assessment does not replace that evaluation, and the importance of remaining in the ED  until their evaluation is complete.     Amanda Fend, PA-C 06/04/21 6644    Gerhard Munch, MD 06/04/21 [phone]

## 2021-10-24 ENCOUNTER — Other Ambulatory Visit: Payer: Self-pay

## 2021-10-24 ENCOUNTER — Emergency Department (HOSPITAL_COMMUNITY): Payer: Medicaid Other

## 2021-10-24 ENCOUNTER — Emergency Department (HOSPITAL_COMMUNITY)
Admission: EM | Admit: 2021-10-24 | Discharge: 2021-10-25 | Discharge status: Against Medical Advice | Payer: Medicaid Other | Attending: Emergency Medicine | Admitting: Emergency Medicine

## 2021-10-24 DIAGNOSIS — R0602 Shortness of breath: Secondary | ICD-10-CM | POA: Insufficient documentation

## 2021-10-24 DIAGNOSIS — Z5321 Procedure and treatment not carried out due to patient leaving prior to being seen by health care provider: Secondary | ICD-10-CM | POA: Insufficient documentation

## 2021-10-24 DIAGNOSIS — R072 Precordial pain: Secondary | ICD-10-CM | POA: Diagnosis not present

## 2021-10-24 LAB — BASIC METABOLIC PANEL
Anion gap: 12 (ref 5–15)
BUN: <5 mg/dL — ABNORMAL LOW (ref 6–20)
CO2: 22 mmol/L (ref 22–32)
Calcium: 8.9 mg/dL (ref 8.9–10.3)
Chloride: 104 mmol/L (ref 98–111)
Creatinine, Ser: 0.73 mg/dL (ref 0.44–1.00)
GFR, Estimated: >60 mL/min (ref >60)
Glucose, Bld: 92 mg/dL (ref 70–99)
Potassium: 3.6 mmol/L (ref 3.5–5.1)
Sodium: 138 mmol/L (ref 135–145)

## 2021-10-24 LAB — CBC
HCT: 36.8 % (ref 36.0–46.0)
Hemoglobin: 11.3 g/dL — ABNORMAL LOW (ref 12.0–15.0)
MCH: 22.1 pg — ABNORMAL LOW (ref 26.0–34.0)
MCHC: 30.7 g/dL (ref 30.0–36.0)
MCV: 72 fL — ABNORMAL LOW (ref 80.0–100.0)
Platelets: 320 10*3/uL (ref 150–400)
RBC: 5.11 MIL/uL (ref 3.87–5.11)
RDW: 14 % (ref 11.5–15.5)
WBC: 4.4 10*3/uL (ref 4.0–10.5)
nRBC: 0 % (ref 0.0–0.2)

## 2021-10-24 LAB — I-STAT BETA HCG BLOOD, ED (MC, WL, AP ONLY): I-stat hCG, quantitative: <5 m[IU]/mL (ref <5)

## 2021-10-24 LAB — TROPONIN I (HIGH SENSITIVITY)
Troponin I (High Sensitivity): 2 ng/L (ref <18)
Troponin I (High Sensitivity): 2 ng/L (ref <18)

## 2021-10-24 NOTE — ED Provider Triage Note (Signed)
Emergency Medicine Provider Triage Evaluation Note  [name] , a 26 y.o. female  was evaluated in triage.  Pt complains of substernal chest tightness and shortness of breath.  Started this morning.  Briefly improved after albuterol inhaler but returned prompting her arrival today.  Was seen back in October 2022 for similar symptoms.  Review of Systems  Positive:  Negative: See above   Physical Exam  BP 115/81 (BP Location: Right Arm)    Pulse 68    Temp 98.3 F (36.8 C) (Oral)    Resp 18    SpO2 98%  Gen:   Awake, no distress   Resp:  Normal effort  MSK:   Moves extremities without difficulty  Other:    Medical Decision Making  Medically screening exam initiated at 4:23 PM.  Appropriate orders placed.  [name] was informed that the remainder of the evaluation will be completed by another provider, this initial triage assessment does not replace that evaluation, and the importance of remaining in the ED until their evaluation is complete.     [name], [place] 10/24/21 1625

## 2021-10-24 NOTE — ED Notes (Signed)
X1 vitals recheck no response °

## 2021-10-24 NOTE — ED Triage Notes (Signed)
Pt reports generalized chest pain, progressing through the morning. Initially just had associated nausea but then progressed to sob, relieved somewhat by her albuterol inhaler. Pain worse when sitting up. Went away at 1400 and now is back. Denies nasal congestion, sore throat, or cough, but does endorse headache last night.

## 2021-10-25 NOTE — ED Notes (Signed)
X2 no response °
